# Patient Record
Sex: Female | Born: 1940 | Race: Black or African American | Hispanic: No | Smoking: Never smoker
Health system: Southern US, Community
[De-identification: ages and names within clinical notes are randomized; demographics above are authoritative.]

## PROBLEM LIST (undated history)

## (undated) DIAGNOSIS — Z9889 Other specified postprocedural states: Secondary | ICD-10-CM

## (undated) DIAGNOSIS — Z90710 Acquired absence of both cervix and uterus: Secondary | ICD-10-CM

## (undated) HISTORY — PX: COLONOSCOPY: SHX174

## (undated) HISTORY — PX: ABDOMINAL HYSTERECTOMY: SHX81

## (undated) HISTORY — PX: FOOT SURGERY: SHX648

---

## 2010-11-11 HISTORY — PX: KNEE ARTHROPLASTY: SHX992

## 2011-11-12 HISTORY — PX: KNEE ARTHROPLASTY: SHX992

## 2019-05-15 ENCOUNTER — Encounter (HOSPITAL_COMMUNITY): Payer: Self-pay | Admitting: Emergency Medicine

## 2019-05-15 ENCOUNTER — Emergency Department (HOSPITAL_COMMUNITY): Payer: Medicare Other

## 2019-05-15 ENCOUNTER — Emergency Department (HOSPITAL_BASED_OUTPATIENT_CLINIC_OR_DEPARTMENT_OTHER): Payer: Medicare Other

## 2019-05-15 ENCOUNTER — Other Ambulatory Visit: Payer: Self-pay

## 2019-05-15 ENCOUNTER — Observation Stay (HOSPITAL_COMMUNITY)
Admission: EM | Admit: 2019-05-15 | Discharge: 2019-05-16 | Disposition: A | Discharge status: Home / Self Care | Payer: Medicare Other | Attending: Internal Medicine | Admitting: Internal Medicine

## 2019-05-15 DIAGNOSIS — Z1159 Encounter for screening for other viral diseases: Secondary | ICD-10-CM | POA: Diagnosis not present

## 2019-05-15 DIAGNOSIS — I2699 Other pulmonary embolism without acute cor pulmonale: Secondary | ICD-10-CM

## 2019-05-15 DIAGNOSIS — Z86718 Personal history of other venous thrombosis and embolism: Secondary | ICD-10-CM | POA: Diagnosis not present

## 2019-05-15 DIAGNOSIS — Z7901 Long term (current) use of anticoagulants: Secondary | ICD-10-CM | POA: Diagnosis not present

## 2019-05-15 DIAGNOSIS — I361 Nonrheumatic tricuspid (valve) insufficiency: Secondary | ICD-10-CM | POA: Diagnosis not present

## 2019-05-15 DIAGNOSIS — I82431 Acute embolism and thrombosis of right popliteal vein: Secondary | ICD-10-CM | POA: Diagnosis present

## 2019-05-15 DIAGNOSIS — Z79899 Other long term (current) drug therapy: Secondary | ICD-10-CM | POA: Insufficient documentation

## 2019-05-15 HISTORY — DX: Other specified postprocedural states: Z98.890

## 2019-05-15 HISTORY — DX: Acquired absence of both cervix and uterus: Z90.710

## 2019-05-15 LAB — SARS CORONAVIRUS 2 BY RT PCR (HOSPITAL ORDER, PERFORMED IN ~~LOC~~ HOSPITAL LAB): SARS Coronavirus 2: NEGATIVE

## 2019-05-15 LAB — COMPREHENSIVE METABOLIC PANEL
ALT: 15 U/L (ref 0–44)
AST: 22 U/L (ref 15–41)
Albumin: 3.8 g/dL (ref 3.5–5.0)
Alkaline Phosphatase: 76 U/L (ref 38–126)
Anion gap: 11 (ref 5–15)
BUN: 13 mg/dL (ref 8–23)
CO2: 21 mmol/L — ABNORMAL LOW (ref 22–32)
Calcium: 9.7 mg/dL (ref 8.9–10.3)
Chloride: 105 mmol/L (ref 98–111)
Creatinine, Ser: 1.17 mg/dL — ABNORMAL HIGH (ref 0.44–1.00)
GFR calc Af Amer: 52 mL/min — ABNORMAL LOW (ref >60)
GFR calc non Af Amer: 45 mL/min — ABNORMAL LOW (ref >60)
Glucose, Bld: 107 mg/dL — ABNORMAL HIGH (ref 70–99)
Potassium: 3.9 mmol/L (ref 3.5–5.1)
Sodium: 137 mmol/L (ref 135–145)
Total Bilirubin: 1.5 mg/dL — ABNORMAL HIGH (ref 0.3–1.2)
Total Protein: 7.6 g/dL (ref 6.5–8.1)

## 2019-05-15 LAB — CBC
HCT: 41.4 % (ref 36.0–46.0)
Hemoglobin: 14.1 g/dL (ref 12.0–15.0)
MCH: 29.6 pg (ref 26.0–34.0)
MCHC: 34.1 g/dL (ref 30.0–36.0)
MCV: 86.8 fL (ref 80.0–100.0)
Platelets: 142 10*3/uL — ABNORMAL LOW (ref 150–400)
RBC: 4.77 MIL/uL (ref 3.87–5.11)
RDW: 13.6 % (ref 11.5–15.5)
WBC: 9.1 10*3/uL (ref 4.0–10.5)
nRBC: 0 % (ref 0.0–0.2)

## 2019-05-15 LAB — ECHOCARDIOGRAM COMPLETE
Height: 63 in
Weight: 2368 oz

## 2019-05-15 LAB — TROPONIN I (HIGH SENSITIVITY)
Troponin I (High Sensitivity): 4 ng/L (ref <18)
Troponin I (High Sensitivity): 4 ng/L (ref <18)

## 2019-05-15 LAB — D-DIMER, QUANTITATIVE: D-Dimer, Quant: >20 ug/mL-FEU — ABNORMAL HIGH (ref 0.00–0.50)

## 2019-05-15 MED ORDER — HEPARIN BOLUS VIA INFUSION
4000.0000 [IU] | Freq: Once | INTRAVENOUS | Status: AC
  Administered 2019-05-15: 13:00:00 4000 [IU] via INTRAVENOUS
  Filled 2019-05-15: qty 4000

## 2019-05-15 MED ORDER — SODIUM CHLORIDE 0.9% FLUSH: 3.0000 mL | Freq: Once | INTRAVENOUS | Status: DC

## 2019-05-15 MED ORDER — MORPHINE SULFATE (PF) 4 MG/ML IV SOLN
4.0000 mg | Freq: Once | INTRAVENOUS | Status: AC
  Administered 2019-05-15: 4 mg via INTRAVENOUS
  Filled 2019-05-15 (×2): qty 1

## 2019-05-15 MED ORDER — RIVAROXABAN 15 MG PO TABS
15.0000 mg | ORAL_TABLET | Freq: Two times a day (BID) | ORAL | Status: DC
  Administered 2019-05-15 – 2019-05-16 (×2): 15 mg via ORAL
  Filled 2019-05-15 (×3): qty 1

## 2019-05-15 MED ORDER — VITAMIN D 25 MCG (1000 UNIT) PO TABS
1000.0000 [IU] | ORAL_TABLET | Freq: Every day | ORAL | Status: DC
  Administered 2019-05-16: 1000 [IU] via ORAL
  Filled 2019-05-15: qty 1

## 2019-05-15 MED ORDER — IOHEXOL 350 MG/ML SOLN
100.0000 mL | Freq: Once | INTRAVENOUS | Status: AC | PRN
  Administered 2019-05-15: 12:00:00 100 mL via INTRAVENOUS

## 2019-05-15 MED ORDER — KETOROLAC TROMETHAMINE 30 MG/ML IJ SOLN
15.0000 mg | Freq: Once | INTRAMUSCULAR | Status: AC
  Administered 2019-05-15: 15 mg via INTRAVENOUS
  Filled 2019-05-15: qty 1

## 2019-05-15 MED ORDER — RIVAROXABAN 20 MG PO TABS: 20.0000 mg | ORAL_TABLET | Freq: Every day | ORAL | Status: DC

## 2019-05-15 MED ORDER — KETOROLAC TROMETHAMINE 15 MG/ML IJ SOLN
15.0000 mg | Freq: Three times a day (TID) | INTRAMUSCULAR | Status: DC | PRN
  Administered 2019-05-15: 15 mg via INTRAVENOUS
  Filled 2019-05-15: qty 1

## 2019-05-15 MED ORDER — ACETAMINOPHEN 650 MG RE SUPP: 650.0000 mg | Freq: Four times a day (QID) | RECTAL | Status: DC | PRN

## 2019-05-15 MED ORDER — ACETAMINOPHEN 325 MG PO TABS: 650.0000 mg | ORAL_TABLET | Freq: Four times a day (QID) | ORAL | Status: DC | PRN

## 2019-05-15 MED ORDER — HEPARIN (PORCINE) 25000 UT/250ML-% IV SOLN
1100.0000 [IU]/h | INTRAVENOUS | Status: DC
  Administered 2019-05-15: 1100 [IU]/h via INTRAVENOUS
  Filled 2019-05-15: qty 250

## 2019-05-15 NOTE — ED Triage Notes (Addendum)
Pt reports left upper abdominal pain under the left breast, with pain with inspiration since last night. Denies N/V/D/fevers. Pt moved from [place] in April. Pt endorses slight cough but states it is chronic.

## 2019-05-15 NOTE — Progress Notes (Signed)
  Echocardiogram 2D Echocardiogram has been performed.  Tara Long 05/15/2019, 2:25 PM

## 2019-05-15 NOTE — Progress Notes (Addendum)
ANTICOAGULATION CONSULT NOTE - Initial Consult  Pharmacy Consult for heparin Indication: pulmonary embolus  No Known Allergies  Patient Measurements: Height: 5\' 3"  (160 cm)(patient reported) Weight: 148 lb (67.1 kg)(patient reported) IBW/kg (Calculated) : 52.4 Heparin Dosing Weight: 66kg  Vital Signs: Temp: 99.4 F (37.4 C) (07/04 0835) Temp Source: Oral (07/04 0835) BP: 110/71 (07/04 1100) Pulse Rate: 76 (07/04 1139)  Labs: Recent Labs    05/15/19 0847 05/15/19 1036  HGB 14.1  --   HCT 41.4  --   PLT 142*  --   CREATININE 1.17*  --   TROPONINIHS 4 4    Estimated Creatinine Clearance: 37.1 mL/min (A) (by C-G formula based on SCr of 1.17 mg/dL (H)).   Medical History: Past Medical History:  Diagnosis Date  . H/O knee surgery   . H/O: hysterectomy     Medications:  Infusions:  . heparin      Assessment: 48 yof presented to the ED with abdominal pain and shortness of breath. CT scan revealed a bilateral PE without right heart strain. To start IV heparin. Baseline Hgb is WNL and platelets are slightly low. She is not on anticoagulation PTA.   Goal of Therapy:  Heparin level 0.3-0.7 units/ml Monitor platelets by anticoagulation protocol: Yes   Plan:  Heparin bolus 4000 units IV x 1 Heparin gtt 1100 units/hr Check an 8 hr heparin level Daily heparin level and CBC  Shandell Giovanni, Rande Lawman 05/15/2019,12:39 PM  Addendum: Now changing heparin to xarelto. Will give xarelto 15mg  PO BID x 21 days then 20mg  PO daily thereafter. Will need to monitor renal function and watch closely for bleeding.   Salome Arnt, PharmD, BCPS Please see AMION for all pharmacy numbers 05/15/2019 2:10 PM

## 2019-05-15 NOTE — ED Notes (Signed)
ED TO INPATIENT HANDOFF REPORT  ED Nurse Name and Phone #:  [phone]  S Name/Age/Gender Tara Long  78 y.o. female Room/Bed: 021C/021C  Code Status   Code Status: Full Code  Home/SNF/Other Home Patient oriented to: self Is this baseline? Yes   Triage Complete: Triage complete  Chief Complaint Side Pain (under breast)  Triage Note Pt reports left upper abdominal pain under the left breast, with pain with inspiration since last night. Denies N/V/D/fevers. Pt moved from [place] in April. Pt endorses slight cough but states it is chronic.    Allergies No Known Allergies  Level of Care/Admitting Diagnosis ED Disposition    ED Disposition Condition Comment   Admit  Hospital Area: [facility] [[id number]]  Level of Care: Telemetry Medical [104]  Covid Evaluation: Confirmed COVID Negative  Diagnosis: Bilateral pulmonary embolism [facility]) [[id number]]  Admitting Physician: Aline Brochure  Attending Physician: Larey Dresser A [[id number]]  PT Class (Do Not Modify): Observation [104]  PT Acc Code (Do Not Modify): Observation [10022]       B Medical/Surgery History Past Medical History:  Diagnosis Date  . H/O knee surgery   . H/O: hysterectomy    History reviewed. No pertinent surgical history.   A IV Location/Drains/Wounds Patient Lines/Drains/Airways Status   Active Line/Drains/Airways    Name:   Placement date:   Placement time:   Site:   Days:   Peripheral IV 05/15/19 Right Antecubital   05/15/19    0846    Antecubital   less than 1          Intake/Output Last 24 hours No intake or output data in the 24 hours ending 05/15/19 1433  Labs/Imaging Results for orders placed or performed during the hospital encounter of 05/15/19 (from the past 48 hour(s))  Comprehensive metabolic panel     Status: Abnormal   Collection Time: 05/15/19  8:47 AM  Result Value Ref Range   Sodium 137 135 - 145 mmol/L   Potassium 3.9 3.5 - 5.1 mmol/L   Chloride  105 98 - 111 mmol/L   CO2 21 (L) 22 - 32 mmol/L   Glucose, Bld 107 (H) 70 - 99 mg/dL   BUN 13 8 - 23 mg/dL   Creatinine, Ser 1.17 (H) 0.44 - 1.00 mg/dL   Calcium 9.7 8.9 - 10.3 mg/dL   Total Protein 7.6 6.5 - 8.1 g/dL   Albumin 3.8 3.5 - 5.0 g/dL   AST 22 15 - 41 U/L   ALT 15 0 - 44 U/L   Alkaline Phosphatase 76 38 - 126 U/L   Total Bilirubin 1.5 (H) 0.3 - 1.2 mg/dL   GFR calc non Af Amer 45 (L) >60 mL/min   GFR calc Af Amer 52 (L) >60 mL/min   Anion gap 11 5 - 15    Comment: Performed at [facility], [address], [place], [place] [address]  CBC     Status: Abnormal   Collection Time: 05/15/19  8:47 AM  Result Value Ref Range   WBC 9.1 4.0 - 10.5 K/uL   RBC 4.77 3.87 - 5.11 MIL/uL   Hemoglobin 14.1 12.0 - 15.0 g/dL   HCT 41.4 36.0 - 46.0 %   MCV 86.8 80.0 - 100.0 fL   MCH 29.6 26.0 - 34.0 pg   MCHC 34.1 30.0 - 36.0 g/dL   RDW 13.6 11.5 - 15.5 %   Platelets 142 (L) 150 - 400 K/uL   nRBC 0.0 0.0 -  0.2 %    Comment: Performed at [facility], [address] [place], [place] [address]  Troponin I (High Sensitivity)     Status: None   Collection Time: 05/15/19  8:47 AM  Result Value Ref Range   Troponin I (High Sensitivity) 4 <18 ng/L    Comment: (NOTE) Elevated high sensitivity troponin I (hsTnI) values and significant  changes across serial measurements may suggest ACS but many other  chronic and acute conditions are known to elevate hsTnI results.  Refer to the "Links" section for chest pain algorithms and additional  guidance. Performed at [facility], [address] [place], [place] [address]   D-dimer, quantitative (not at [facility])     Status: Abnormal   Collection Time: 05/15/19  8:57 AM  Result Value Ref Range   D-Dimer, Quant >20.00 (H) 0.00 - 0.50 ug/mL-FEU    Comment: REPEATED TO VERIFY CRITICAL RESULT CALLED TO, READ BACK BY AND VERIFIED WITH: N.ZOHBI,RN @ 1020 05/15/2019 WEBBERJ (NOTE) At the manufacturer cut-off of 0.50 ug/mL FEU, this  assay has been documented to exclude PE with a sensitivity and negative predictive value of 97 to 99%.  At this time, this assay has not been approved by the FDA to exclude DVT/VTE. Results should be correlated with clinical presentation. Performed at [facility], [address] [place], [place] [address]   Troponin I (High Sensitivity)     Status: None   Collection Time: 05/15/19 10:36 AM  Result Value Ref Range   Troponin I (High Sensitivity) 4 <18 ng/L    Comment: (NOTE) Elevated high sensitivity troponin I (hsTnI) values and significant  changes across serial measurements may suggest ACS but many other  chronic and acute conditions are known to elevate hsTnI results.  Refer to the "Links" section for chest pain algorithms and additional  guidance. Performed at [facility], [address] [place], [place] [address]   SARS Coronavirus 2 (CEPHEID- Performed in [facility] hospital lab), Hosp Order     Status: None   Collection Time: 05/15/19 10:36 AM   Specimen: Nasopharyngeal Swab  Result Value Ref Range   SARS Coronavirus 2 NEGATIVE NEGATIVE    Comment: (NOTE) If result is NEGATIVE SARS-CoV-2 target nucleic acids are NOT DETECTED. The SARS-CoV-2 RNA is generally detectable in upper and lower  respiratory specimens during the acute phase of infection. The lowest  concentration of SARS-CoV-2 viral copies this assay can detect is 250  copies / mL. A negative result does not preclude SARS-CoV-2 infection  and should not be used as the sole basis for treatment or other  patient management decisions.  A negative result may occur with  improper specimen collection / handling, submission of specimen other  than nasopharyngeal swab, presence of viral mutation(s) within the  areas targeted by this assay, and inadequate number of viral copies  (<250 copies / mL). A negative result must be combined with clinical  observations, patient history, and epidemiological  information. If result is POSITIVE SARS-CoV-2 target nucleic acids are DETECTED. The SARS-CoV-2 RNA is generally detectable in upper and lower  respiratory specimens dur ing the acute phase of infection.  Positive  results are indicative of active infection with SARS-CoV-2.  Clinical  correlation with patient history and other diagnostic information is  necessary to determine patient infection status.  Positive results do  not rule out bacterial infection or co-infection with other viruses. If result is PRESUMPTIVE POSTIVE SARS-CoV-2 nucleic acids  MAY BE PRESENT.   A presumptive positive result was obtained on the submitted specimen  and confirmed on repeat testing.  While 2019 novel coronavirus  (SARS-CoV-2) nucleic acids may be present in the submitted sample  additional confirmatory testing may be necessary for epidemiological  and / or clinical management purposes  to differentiate between  SARS-CoV-2 and other Sarbecovirus currently known to infect humans.  If clinically indicated additional testing with an alternate test  methodology [phone]([id number]) is advised. The SARS-CoV-2 RNA is generally  detectable in upper and lower respiratory sp ecimens during the acute  phase of infection. The expected result is Negative. Fact Sheet for Patients:  BoilerBrush.com.cyhttps://www.fda.gov/media/136312/download Fact Sheet for Healthcare Providers: https://pope.com/https://www.fda.gov/media/136313/download This test is not yet approved or cleared by the [place] FDA and has been authorized for detection and/or diagnosis of SARS-CoV-2 by FDA under an Emergency Use Authorization (EUA).  This EUA will remain in effect (meaning this test can be used) for the duration of the COVID-19 declaration under Section 564(b)(1) of the Act, 21 U.S.C. section 360bbb-3(b)(1), unless the authorization is terminated or revoked sooner. Performed at [facility], [address] [place], [place] [address]    Dg Chest 2 View  Result  Date: 05/15/2019 CLINICAL DATA:  Chest pain. EXAM: CHEST - 2 VIEW COMPARISON:  None. FINDINGS: The heart size and mediastinal contours are within normal limits. No pneumothorax is noted. Right lung is clear. Mild left basilar atelectasis is noted with minimal pleural effusion. The visualized skeletal structures are unremarkable. IMPRESSION: Mild left basilar atelectasis is noted with minimal left pleural effusion. Electronically Signed   By: Lupita RaiderJames  Green Jr M.D.   On: 05/15/2019 09:28   Ct Angio Chest Pe W And/or Wo Contrast  Result Date: 05/15/2019 CLINICAL DATA:  78 year old female with left-sided chest pain EXAM: CT ANGIOGRAPHY CHEST WITH CONTRAST TECHNIQUE: Multidetector CT imaging of the chest was performed using the standard protocol during bolus administration of intravenous contrast. Multiplanar CT image reconstructions and MIPs were obtained to evaluate the vascular anatomy. CONTRAST:  100mL OMNIPAQUE IOHEXOL 350 MG/ML SOLN COMPARISON:  Chest x-ray 05/15/2019 FINDINGS: Cardiovascular: Adequate opacification of the pulmonary arteries to the segmental level. Positive for acute pulmonary embolus. Filling defects are present within the left main pulmonary artery extending into the left upper and lower lobe pulmonary arteries as well as segmental branches. Nearly occlusive thrombus in the left lower lobe pulmonary artery. On the right, emboli are present within segmental upper and lower lobe pulmonary arteries. The main pulmonary artery is normal in caliber. No evidence of right heart strain. No pericardial effusion. Mediastinum/Nodes: Unremarkable CT appearance of the thyroid gland. No suspicious mediastinal or hilar adenopathy. No soft tissue mediastinal mass. The thoracic esophagus is unremarkable. Lungs/Pleura: Mild dependent atelectasis. Additional focal peripheral consolidation in the posterior left lower lobe may represent a region of a developing pulmonary infarct. No suspicious nodule or mass. Upper  Abdomen: No acute abnormality within the visualized upper abdomen. Musculoskeletal: No acute fracture or aggressive appearing lytic or blastic osseous lesion. Review of the MIP images confirms the above findings. IMPRESSION: 1. Acute bilateral pulmonary emboli worse on the left than the right without evidence of acute right heart strain. There is a region of focal consolidation in the posterior aspect of the left lower lobe which may represent a developing pulmonary infarct or region of alveolar hemorrhage. 2. Mild dependent lower lobe atelectasis. These results were called by telephone at the time of interpretation on 05/15/2019 at 12:10 pm  to [facility] FAWZE , who verbally acknowledged these results. Electronically Signed   By: Malachy Moan M.D.   On: 05/15/2019 12:11   Vas [place] Lower Extremity Venous (dvt) (mc And Wl 7a-7p)  Result Date: 05/15/2019  Lower Venous Study Indications: Pulmonary embolism.  Comparison Study: No prior study. Performing Technologist: Gertie Fey MHA, RDMS, RVT, RDCS  Examination Guidelines: A complete evaluation includes B-mode imaging, spectral Doppler, color Doppler, and power Doppler as needed of all accessible portions of each vessel. Bilateral testing is considered an integral part of a complete examination. Limited examinations for reoccurring indications may be performed as noted.  +---------+---------------+---------+-----------+----------+-------+ RIGHT    CompressibilityPhasicitySpontaneityPropertiesSummary +---------+---------------+---------+-----------+----------+-------+ CFV      Full           Yes      Yes                          +---------+---------------+---------+-----------+----------+-------+ SFJ      Full                                                 +---------+---------------+---------+-----------+----------+-------+ FV Prox  Full                                                  +---------+---------------+---------+-----------+----------+-------+ FV Mid   Full                                                 +---------+---------------+---------+-----------+----------+-------+ FV DistalFull                                                 +---------+---------------+---------+-----------+----------+-------+ PFV      Full                                                 +---------+---------------+---------+-----------+----------+-------+ POP      None                    No                   Acute   +---------+---------------+---------+-----------+----------+-------+ PTV      None                    No                   Acute   +---------+---------------+---------+-----------+----------+-------+ PERO     None                    No                   Acute   +---------+---------------+---------+-----------+----------+-------+   +---------+---------------+---------+-----------+----------+-------+ LEFT     CompressibilityPhasicitySpontaneityPropertiesSummary +---------+---------------+---------+-----------+----------+-------+ CFV      Full  Yes      Yes                          +---------+---------------+---------+-----------+----------+-------+ SFJ      Full                                                 +---------+---------------+---------+-----------+----------+-------+ FV Prox  Full                                                 +---------+---------------+---------+-----------+----------+-------+ FV Mid   Full                                                 +---------+---------------+---------+-----------+----------+-------+ FV DistalFull                                                 +---------+---------------+---------+-----------+----------+-------+ PFV      Full                                                 +---------+---------------+---------+-----------+----------+-------+ POP      Full            Yes      Yes                          +---------+---------------+---------+-----------+----------+-------+ PTV      Full                                                 +---------+---------------+---------+-----------+----------+-------+ PERO     Full                                                 +---------+---------------+---------+-----------+----------+-------+     Summary: Right: Findings consistent with acute deep vein thrombosis involving the right popliteal vein, right posterior tibial veins, and right peroneal veins. No cystic structure found in the popliteal fossa. Left: There is no evidence of deep vein thrombosis in the lower extremity. No cystic structure found in the popliteal fossa.  *See table(s) above for measurements and observations.    Preliminary     Pending Labs Unresulted Labs (From admission, onward)   None      Vitals/Pain Today's Vitals   05/15/19 1245 05/15/19 1300 05/15/19 1315 05/15/19 1330  BP: 119/65 128/67 126/82 (!) 107/96  Pulse: 73     Resp: 19 20 17 16   Temp:      TempSrc:      SpO2: 99%  Weight:      Height:      PainSc:        Isolation Precautions No active isolations  Medications Medications  cholecalciferol (VITAMIN D3) tablet 1,000 Units (has no administration in time range)  acetaminophen (TYLENOL) tablet 650 mg (has no administration in time range)    Or  acetaminophen (TYLENOL) suppository 650 mg (has no administration in time range)  Rivaroxaban (XARELTO) tablet 15 mg (has no administration in time range)    Followed by  rivaroxaban (XARELTO) tablet 20 mg (has no administration in time range)  morphine 4 MG/ML injection 4 mg (4 mg Intravenous Given 05/15/19 1114)  ketorolac (TORADOL) 30 MG/ML injection 15 mg (15 mg Intravenous Given 05/15/19 0956)  iohexol (OMNIPAQUE) 350 MG/ML injection 100 mL (100 mLs Intravenous Contrast Given 05/15/19 1155)  heparin bolus via infusion 4,000 Units (4,000 Units  Intravenous Bolus from Bag 05/15/19 1250)    Mobility walks Low fall risk          R Recommendations: See Admitting Provider Note  Report given to:   Additional Notes:

## 2019-05-15 NOTE — ED Provider Notes (Signed)
[facility] EMERGENCY DEPARTMENT Provider Note   CSN: [id number] Arrival date & time: 05/15/19  1324    History   Chief Complaint Chief Complaint  Patient presents with  . Abdominal Pain  . Shortness of Breath    HPI Tara Long is a 78 y.o. female presents for evaluation of acute onset, constant left sided chest pain. Symptoms began around 2am, and she states the pain woke her from her sleep.  Reports the pain is aching, worsens with deep inspiration.  Denies shortness of breath but feels as though it is difficult to take a deep breath.  Denies fever, cough, abdominal pain, nausea, vomiting.  Pain is localized to the left lower chest just under the left breast.  Denies diaphoresis, lightheadedness, syncope, diarrhea, constipation.  Recently moved to [place] from [place] in April and drove down.  Denies recent travel in the last month, no recent surgeries.  Denies hemoptysis, estrogen hormone replacement therapy, prior history of DVT or PE.  She does report that 2 weeks ago she had some leg swelling to the medial right lower leg.  She reports that it was not painful and resolved on its own.      The history is provided by the patient.    Past Medical History:  Diagnosis Date  . H/O knee surgery   . H/O: hysterectomy     There are no active problems to display for this patient.   History reviewed. No pertinent surgical history.   OB History   No obstetric history on file.      Home Medications    Prior to Admission medications   Medication Sig Start Date End Date Taking? Authorizing Provider  cholecalciferol (VITAMIN D3) 25 MCG (1000 UT) tablet Take 1,000 Units by mouth daily.   Yes [provider]  Multiple Vitamin (MULTIVITAMIN WITH MINERALS) TABS tablet Take 1 tablet by mouth daily.   Yes [provider]    Family History No family history on file.  Social History Social History   Tobacco Use  . Smoking status: Not  on file  Substance Use Topics  . Alcohol use: Not on file  . Drug use: Not on file     Allergies   Patient has no known allergies.   Review of Systems Review of Systems  Constitutional: Negative for chills and fever.  Respiratory: Positive for shortness of breath.   Cardiovascular: Positive for chest pain.  Gastrointestinal: Negative for abdominal pain, nausea and vomiting.  All other systems reviewed and are negative.    Physical Exam Updated Vital Signs BP 126/82   Pulse 73   Temp 99.4 F (37.4 C) (Oral)   Resp 17   Ht  (1.6 m) Comment: patient reported  Wt 67.1 kg Comment: patient reported  SpO2 99%   BMI 26.22 kg/m   Physical Exam Vitals signs and nursing note reviewed.  Constitutional:      General: She is not in acute distress.    Appearance: She is well-developed.  HENT:     Head: Normocephalic and atraumatic.  Eyes:     General:        Right eye: No discharge.        Left eye: No discharge.     Conjunctiva/sclera: Conjunctivae normal.  Neck:     Vascular: No JVD.     Trachea: No tracheal deviation.  Cardiovascular:     Rate and Rhythm: Regular rhythm. Tachycardia present.     Heart  sounds: Normal heart sounds.     Comments: Intermittently tachycardic up to 110 bpm.   2+ radial and DP/PT pulses bilaterally, Homans sign absent bilaterally, no lower extremity edema, no palpable cords, compartments are soft  Pulmonary:     Effort: Pulmonary effort is normal.     Comments: Breath sounds diminished as patient is hesitant to take a deep breath due to pain.  Speaking in full sentences without difficulty.  No tenderness to palpation of the chest wall with no deformity, crepitus, ecchymosis, or flail segment Abdominal:     General: Abdomen is flat. Bowel sounds are normal. There is no distension.     Palpations: Abdomen is soft.     Tenderness: There is no abdominal tenderness. There is no guarding.  Skin:    General: Skin is warm and dry.      Findings: No erythema.  Neurological:     Mental Status: She is alert.  Psychiatric:        Behavior: Behavior normal.      ED Treatments / Results  Labs (all labs ordered are listed, but only abnormal results are displayed) Labs Reviewed  COMPREHENSIVE METABOLIC PANEL - Abnormal; Notable for the following components:      Result Value   CO2 21 (*)    Glucose, Bld 107 (*)    Creatinine, Ser 1.17 (*)    Total Bilirubin 1.5 (*)    GFR calc non Af Amer 45 (*)    GFR calc Af Amer 52 (*)    All other components within normal limits  CBC - Abnormal; Notable for the following components:   Platelets 142 (*)    All other components within normal limits  D-DIMER, QUANTITATIVE (NOT AT [facility]) - Abnormal; Notable for the following components:   D-Dimer, Quant >20.00 (*)    All other components within normal limits  SARS CORONAVIRUS 2 (HOSPITAL ORDER, PERFORMED IN [facility])  TROPONIN I (HIGH SENSITIVITY)  TROPONIN I (HIGH SENSITIVITY)  HEPARIN LEVEL (UNFRACTIONATED)    EKG EKG Interpretation  Date/Time:  Saturday May 15 2019 08:33:12 EDT Ventricular Rate:  99 PR Interval:    QRS Duration: 64 QT Interval:  312 QTC Calculation: 401 R Axis:   73 Text Interpretation:  Sinus rhythm No prior ECG for comparison.  No STEMI Confirmed by Theda Belfastegeler, Chris ([id number]) on 05/15/2019 8:58:52 AM   Radiology Dg Chest 2 View  Result Date: 05/15/2019 CLINICAL DATA:  Chest pain. EXAM: CHEST - 2 VIEW COMPARISON:  None. FINDINGS: The heart size and mediastinal contours are within normal limits. No pneumothorax is noted. Right lung is clear. Mild left basilar atelectasis is noted with minimal pleural effusion. The visualized skeletal structures are unremarkable. IMPRESSION: Mild left basilar atelectasis is noted with minimal left pleural effusion. Electronically Signed   By: Lupita RaiderJames  Green Jr M.D.   On: 05/15/2019 09:28   Ct Angio Chest Pe W And/or Wo Contrast  Result Date: 05/15/2019 CLINICAL  DATA:  78 year old female with left-sided chest pain EXAM: CT ANGIOGRAPHY CHEST WITH CONTRAST TECHNIQUE: Multidetector CT imaging of the chest was performed using the standard protocol during bolus administration of intravenous contrast. Multiplanar CT image reconstructions and MIPs were obtained to evaluate the vascular anatomy. CONTRAST:  100mL OMNIPAQUE IOHEXOL 350 MG/ML SOLN COMPARISON:  Chest x-ray 05/15/2019 FINDINGS: Cardiovascular: Adequate opacification of the pulmonary arteries to the segmental level. Positive for acute pulmonary embolus. Filling defects are present within the left main pulmonary artery extending into the left upper  and lower lobe pulmonary arteries as well as segmental branches. Nearly occlusive thrombus in the left lower lobe pulmonary artery. On the right, emboli are present within segmental upper and lower lobe pulmonary arteries. The main pulmonary artery is normal in caliber. No evidence of right heart strain. No pericardial effusion. Mediastinum/Nodes: Unremarkable CT appearance of the thyroid gland. No suspicious mediastinal or hilar adenopathy. No soft tissue mediastinal mass. The thoracic esophagus is unremarkable. Lungs/Pleura: Mild dependent atelectasis. Additional focal peripheral consolidation in the posterior left lower lobe may represent a region of a developing pulmonary infarct. No suspicious nodule or mass. Upper Abdomen: No acute abnormality within the visualized upper abdomen. Musculoskeletal: No acute fracture or aggressive appearing lytic or blastic osseous lesion. Review of the MIP images confirms the above findings. IMPRESSION: 1. Acute bilateral pulmonary emboli worse on the left than the right without evidence of acute right heart strain. There is a region of focal consolidation in the posterior aspect of the left lower lobe which may represent a developing pulmonary infarct or region of alveolar hemorrhage. 2. Mild dependent lower lobe atelectasis. These results  were called by telephone at the time of interpretation on 05/15/2019 at 12:10 pm to [facility] , who verbally acknowledged these results. Electronically Signed   By: Malachy Moan M.D.   On: 05/15/2019 12:11   Vas [place] Lower Extremity Venous (dvt) (mc And Wl 7a-7p)  Result Date: 05/15/2019  Lower Venous Study Indications: Pulmonary embolism.  Comparison Study: No prior study. Performing Technologist: Gertie Fey MHA, RDMS, RVT, RDCS  Examination Guidelines: A complete evaluation includes B-mode imaging, spectral Doppler, color Doppler, and power Doppler as needed of all accessible portions of each vessel. Bilateral testing is considered an integral part of a complete examination. Limited examinations for reoccurring indications may be performed as noted.  +---------+---------------+---------+-----------+----------+-------+ RIGHT    CompressibilityPhasicitySpontaneityPropertiesSummary +---------+---------------+---------+-----------+----------+-------+ CFV      Full           Yes      Yes                          +---------+---------------+---------+-----------+----------+-------+ SFJ      Full                                                 +---------+---------------+---------+-----------+----------+-------+ FV Prox  Full                                                 +---------+---------------+---------+-----------+----------+-------+ FV Mid   Full                                                 +---------+---------------+---------+-----------+----------+-------+ FV DistalFull                                                 +---------+---------------+---------+-----------+----------+-------+ PFV      Full                                                 +---------+---------------+---------+-----------+----------+-------+  POP      None                    No                   Acute   +---------+---------------+---------+-----------+----------+-------+  PTV      None                    No                   Acute   +---------+---------------+---------+-----------+----------+-------+ PERO     None                    No                   Acute   +---------+---------------+---------+-----------+----------+-------+   +---------+---------------+---------+-----------+----------+-------+ LEFT     CompressibilityPhasicitySpontaneityPropertiesSummary +---------+---------------+---------+-----------+----------+-------+ CFV      Full           Yes      Yes                          +---------+---------------+---------+-----------+----------+-------+ SFJ      Full                                                 +---------+---------------+---------+-----------+----------+-------+ FV Prox  Full                                                 +---------+---------------+---------+-----------+----------+-------+ FV Mid   Full                                                 +---------+---------------+---------+-----------+----------+-------+ FV DistalFull                                                 +---------+---------------+---------+-----------+----------+-------+ PFV      Full                                                 +---------+---------------+---------+-----------+----------+-------+ POP      Full           Yes      Yes                          +---------+---------------+---------+-----------+----------+-------+ PTV      Full                                                 +---------+---------------+---------+-----------+----------+-------+ PERO     Full                                                 +---------+---------------+---------+-----------+----------+-------+  Summary: Right: Findings consistent with acute deep vein thrombosis involving the right popliteal vein, right posterior tibial veins, and right peroneal veins. No cystic structure found in the popliteal fossa. Left:  There is no evidence of deep vein thrombosis in the lower extremity. No cystic structure found in the popliteal fossa.  *See table(s) above for measurements and observations.    Preliminary     Procedures .Critical Care Performed by: Jeanie SewerFawze, Jaydy Fitzhenry A, PA-C Authorized by: Jeanie SewerFawze, Shanora Christensen A, PA-C   Critical care provider statement:    Critical care time (minutes):  45   Critical care was necessary to treat or prevent imminent or life-threatening deterioration of the following conditions:  Respiratory failure   Critical care was time spent personally by me on the following activities:  Discussions with consultants, evaluation of patient's response to treatment, examination of patient, ordering and performing treatments and interventions, ordering and review of laboratory studies, ordering and review of radiographic studies, pulse oximetry, re-evaluation of patient's condition, obtaining history from patient or surrogate and review of old charts   I assumed direction of critical care for this patient from another provider in my specialty: no     (including critical care time)  Medications Ordered in ED Medications  sodium chloride flush (NS) 0.9 % injection 3 mL (3 mLs Intravenous Not Given 05/15/19 0934)  heparin ADULT infusion 100 units/mL (25000 units/24250mL sodium chloride 0.45%) (1,100 Units/hr Intravenous New Bag/Given 05/15/19 1249)  morphine 4 MG/ML injection 4 mg (4 mg Intravenous Given 05/15/19 1114)  ketorolac (TORADOL) 30 MG/ML injection 15 mg (15 mg Intravenous Given 05/15/19 0956)  iohexol (OMNIPAQUE) 350 MG/ML injection 100 mL (100 mLs Intravenous Contrast Given 05/15/19 1155)  heparin bolus via infusion 4,000 Units (4,000 Units Intravenous Bolus from Bag 05/15/19 1250)     Initial Impression / Assessment and Plan / ED Course  I have reviewed the triage vital signs and the nursing notes.  Pertinent labs & imaging results that were available during my care of the patient were reviewed by me and  considered in my medical decision making (see chart for details).        Patient presents for evaluation of acute onset pleuritic chest pain on the left which awoke her from her sleep at around 2 AM.  She is afebrile, intermittently tachycardic with low-normal oxygen saturations on room air.  Pain is not reproducible on palpation.  History of right lower extremity swelling 2 weeks ago which resolved.  With her recent car drive down from [place] I am concerned she may have a PE.  Will obtain lab work and imaging and reassess.  EKG shows normal sinus rhythm, no acute ischemic abnormalities noted.  Lab work reviewed by me shows no leukocytosis, no anemia, mild renal insufficiency.  Her d-dimer is markedly elevated so we will obtain DVT study and PE study.  Patient remains in no apparent distress.  Patient with bilateral pleural effusions with possible alveolar hemorrhage versus pulmonary infarct on the left.  Also with acute right lower extremity DVT.  Doubt ACS/MI, dissection, CHF, cardiac tamponade, or pneumonia.  Will initiate heparin.  Internal medicine teaching service to admit.  Patient seen and evaluated by Dr. Rush Landmarkegeler who agrees with assessment and plan at this time.  Final Clinical Impressions(s) / ED Diagnoses   Final diagnoses:  Other acute pulmonary embolism without acute cor pulmonale (HCC)  Acute deep vein thrombosis (DVT) of popliteal vein of right lower extremity [facility](HCC)    ED Discharge Orders  None       Debroah Baller 05/15/19 1332    Tegeler, Gwenyth Allegra, MD 05/16/19 [phone]

## 2019-05-15 NOTE — ED Notes (Signed)
Attempted Report 

## 2019-05-15 NOTE — H&P (Signed)
Date: 05/15/2019               Patient Name:  Tara Long MRN: [id number]  DOB: [birth year] Age / Sex: 78 y.o., female   PCP: Patient, No Pcp Per         Medical Service: Internal Medicine Teaching Service         Attending Physician: Dr. Rush Landmarkegeler, Tara Long, *    First Contact: Dr. Darl PikesLanier Pager: [phone]  Second Contact: Dr. Evelene Long  Pager: [phone]       After Hours (After 5p/  First Contact Pager: [phone]  weekends / holidays): Second Contact Pager: [phone]   Chief Complaint: pain with inspiration  History of Present Illness: Ms. Tara HiddenBean is a pleasant 78 yo F with no significant past medical history who presented to the ED with LUQ pain and pain with inspiration which started overnight. She reports waking up off and on with the pain and difficulty taking a breath and decided to come in after calling her daughter about it this morning. She states that the pain is an 7-8.5/10 in severity and does not radiate. She cannot describe it but states that it is not stabbing or burning or dull. Breathing makes it worse and not breathing and lying still makes it better. When her arms were raised above her head during her CT scan her pain increased. The only associated sx she can think of is swelling in her right medial ankle approximately 1 week ago which she treated with ice, was not painful, and lasted 2-3 days. She denies any recent road trips, plane travel or recent surgeries. When prompted she endorses some slight SOB for the last 1-2 weeks and a slight decrease in her energy the last 1-2 days. Patient denies abdominal pain, nausea, vomiting, diarrhea, fevers, chills, sweats, change in appetite, weight change, rhinorrhea, sore throat, rash and headache.   Meds: Patient reports no prescription medicine use but does take D3 daily as well as an unknown supplement that she purchased for help increase her energy. She does not believe there is anyone at her facility that can tell us the name of  this supplement. She denies any estrogen use.   Current Meds  Medication Sig  . cholecalciferol (VITAMIN D3) 25 MCG (1000 UT) tablet Take 1,000 Units by mouth daily.  . Multiple Vitamin (MULTIVITAMIN WITH MINERALS) TABS tablet Take 1 tablet by mouth daily.   Allergies: Allergies as of 05/15/2019  . (No Known Allergies)   Past Medical History:  Diagnosis Date  . H/O knee surgery   . H/O: hysterectomy    Patient denies any personal history of: COPD Heart disease Hypertension Diabetes Cancer Bleeding disorders  Patient is up to date on mammograms, colonoscopies and pap smears which she reports were clear as recent as a year ago   Family History:  No family history of: Bleeding disorders or heart disease  Mother had hypertension and breast cancer MGM had hypertension  Social History:  Patient lives in a retirement home and has her temperature taken every time she leaves. She was also COVID tested there which was negative.  She has lots of support in the area. She has three children and her daughter lives close by in [place]. She is very active and exercises 5 days a week at her facility. She enjoys yoga in particular. Her diet consists of juice and bread or cereal in the morning, a salad for lunch and chicken or fish in the evening.  Review of Systems: A complete ROS was negative except as per HPI.   Physical Exam: Blood pressure 119/65, pulse 73, temperature 99.4 F (37.4 C), temperature source Oral, resp. rate 19, height 5\' 3"  (1.6 m), weight 67.1 kg, SpO2 99 %. Physical Exam  Constitutional: She is oriented to person, place, and time and well-developed, well-nourished, and in no distress. No distress.  HENT:  Head: Normocephalic and atraumatic.  Neck: Normal range of motion.  Cardiovascular: Normal rate, regular rhythm, normal heart sounds and intact distal pulses.  No murmur heard. Trace edema in right lower extremity  Pulmonary/Chest: Effort normal and breath  sounds normal. No respiratory distress. She has no wheezes.  Abdominal: Soft. Bowel sounds are normal. She exhibits distension. There is no abdominal tenderness.  Musculoskeletal: Normal range of motion.  Neurological: She is alert and oriented to person, place, and time.  Skin: Skin is warm and dry. No rash noted. She is not diaphoretic. No erythema. No pallor.  Psychiatric: Mood, memory, affect and judgment normal.  Nursing note and vitals reviewed.  Labs:  Creatinine 1.17 Total Bili 1.5 WBC 9.1 Hgb 14.1 Platelets 142 High Sensitivity Troponin I: 4 --> 4 D-Dimer >20000 SARS COVID 2: negative  CXR: personally reviewed, my interpretation is slight left atelectasis and left pleural effusion   CT Angio Chest PE W/WO: acute bilateral emboli worse on left than right without evidence of acute right heart strain. There is a region of focal consolidation in the posterior aspect of the left lower lobe which may represent a developing pulmonary infarct or region of alveolar hemorrhage and mild dependent lower lobe atelectasis.   [place] Lower Extremity: Findings consistent with acute deep vein thrombosis involving the right popliteal vein, right posterior tibial veins and right peroneal veins. No cystic structure found in the popliteal fossa. No evidence of DVT in the left lower extremity or cystic structures found in the popliteal fossa.  Echo: 1. Left ventricle has normal systolic function, EF of 96-75% 2. Normal LV function, mild diastolic dysfunction, mild TR with mild pulmonary hypertension   Assessment & Plan by Problem: Tara Long is a 78 yo F with no significant past medical history who presented with acute onset pleuritic chest pain in setting of recent swelling of right lower extremity who was found on imaging to have a bilateral pulmonary embolism.  Active Problems:   Bilateral pulmonary embolism/unprovoked DVT ([facility]) -pt reports severe left sided pleuritic pain with recent right lower  extremity swelling found to have elevated D-Dimer and bilateral PE on CT angio; DVT found on lower extremity ultrasound in right popliteal vein, tibial veins and peroneal veins -no clotting disorders, recent surgeries or travel; patient reports starting an known supplement to help with energy approximately 1 month ago  -received IV heparin in the ED which has been dc'ed and pt started on Xarelto -Echo unremarkable -will be admitted overnight for observation   Dispo: Admit patient to Observation with expected length of stay less than 2 midnights.  Signed: Al Decant, MD 05/15/2019, 1:03 PM  Pager: [phone]

## 2019-05-15 NOTE — Progress Notes (Signed)
Bilateral lower extremity venous duplex completed. Refer to "CV Proc" under chart review to view preliminary results.  Critical results discussed with Dr. Sherry Ruffing.  05/15/2019 1:27 PM Maudry Mayhew, MHA, RVT, RDCS, RDMS

## 2019-05-16 DIAGNOSIS — I82431 Acute embolism and thrombosis of right popliteal vein: Secondary | ICD-10-CM

## 2019-05-16 DIAGNOSIS — I82441 Acute embolism and thrombosis of right tibial vein: Secondary | ICD-10-CM

## 2019-05-16 DIAGNOSIS — I2699 Other pulmonary embolism without acute cor pulmonale: Secondary | ICD-10-CM | POA: Diagnosis not present

## 2019-05-16 DIAGNOSIS — I82451 Acute embolism and thrombosis of right peroneal vein: Secondary | ICD-10-CM

## 2019-05-16 DIAGNOSIS — Z7901 Long term (current) use of anticoagulants: Secondary | ICD-10-CM

## 2019-05-16 MED ORDER — ACETAMINOPHEN 325 MG PO TABS: 650.0000 mg | ORAL_TABLET | Freq: Four times a day (QID) | ORAL | 0 refills | Status: AC | PRN

## 2019-05-16 MED ORDER — RIVAROXABAN (XARELTO) VTE STARTER PACK (15 & 20 MG): ORAL_TABLET | ORAL | 0 refills | Status: DC

## 2019-05-16 NOTE — Discharge Instructions (Signed)
You were admitted for a pulmonary embolism which came from the blood clot in your leg. You were prescribed a blood thinner, Xarelto, to help prevent this from happening again. Please continue to take Xarelto every day and we'll discuss with you at your follow up appointment about how long you should take it. Please also discuss with [place] the supplement you were taking and whether or not you should continue use.

## 2019-05-16 NOTE — TOC Initial Note (Signed)
Transition of Care [facility]) - Initial/Assessment Note    Patient Details  Name: Tara Long MRN: [id number] Date of Birth: 07/17/1941  Transition of Care [facility]) CM/SW Contact:    Bartholomew Crews, RN Phone Number: [phone] 05/16/2019, 2:28 PM  Clinical Narrative:                 Spoke with patient at bedside. Provided Xarelto 30 day card. Advised that pharmacy should be able to look up copay cost. Verified [facility] [place] for [facility]. PCP - Dr. Jonathon Jordan. Encouraged to follow up asap.  No other transition of care needs identified at this time.   Expected Discharge Plan: Home/Self Care Barriers to Discharge: No Barriers Identified   Patient Goals and CMS Choice   CMS Medicare.gov Compare Post Acute Care list provided to:: Patient Choice offered to / list presented to : Patient  Expected Discharge Plan and Services Expected Discharge Plan: Home/Self Care In-house Referral: NA Discharge Planning Services: CM Consult Post Acute Care Choice: NA   Expected Discharge Date: 05/16/19               DME Arranged: N/A DME Agency: NA       HH Arranged: NA          Prior Living Arrangements/Services   Lives with:: Self Patient language and need for interpreter reviewed:: Yes              Criminal Activity/Legal Involvement Pertinent to Current Situation/Hospitalization: No - Comment as needed  Activities of Daily Living Home Assistive Devices/Equipment: None ADL Screening (condition at time of admission) Patient's cognitive ability adequate to safely complete daily activities?: Yes Is the patient deaf or have difficulty hearing?: No Does the patient have difficulty seeing, even when wearing glasses/contacts?: No Does the patient have difficulty concentrating, remembering, or making decisions?: No Patient able to express need for assistance with ADLs?: Yes Does the patient have difficulty dressing or bathing?: No Independently performs ADLs?: Yes (appropriate for  developmental age) Communication: Independent Dressing (OT): Independent Grooming: Independent Feeding: Independent Bathing: Independent Toileting: Independent In/Out Bed: Independent Walks in Home: Independent Does the patient have difficulty walking or climbing stairs?: No Weakness of Legs: None Weakness of Arms/Hands: None  Permission Sought/Granted                  Emotional Assessment Appearance:: Appears stated age Attitude/Demeanor/Rapport: Engaged Affect (typically observed): Accepting Orientation: : Oriented to Self, Oriented to Place, Oriented to  Time, Oriented to Situation Alcohol / Substance Use: Not Applicable Psych Involvement: No (comment)  Admission diagnosis:  Acute deep vein thrombosis (DVT) of popliteal vein of right lower extremity (HCC) [I82.431] Other acute pulmonary embolism without acute cor pulmonale (HCC) [I26.99] Patient Active Problem List   Diagnosis Date Noted  . Bilateral pulmonary embolism ([place]) 05/15/2019   PCP:  Patient, No Pcp Per Pharmacy:   [id number] [place] [place] [address] [place] [address] Phone: [phone] Fax: [phone]     Social Determinants of Health (SDOH) Interventions    Readmission Risk Interventions No flowsheet data found.

## 2019-05-16 NOTE — Plan of Care (Signed)
  Problem: Education: Goal: Knowledge of General Education information will improve Description: Including pain rating scale, medication(s)/side effects and non-pharmacologic comfort measures Outcome: Progressing   Problem: Health Behavior/Discharge Planning: Goal: Ability to manage health-related needs will improve Outcome: Progressing   Problem: Clinical Measurements: Goal: Respiratory complications will improve Outcome: Progressing   Problem: Coping: Goal: Level of anxiety will decrease Outcome: Progressing   Problem: Pain Managment: Goal: General experience of comfort will improve Outcome: Progressing   

## 2019-05-16 NOTE — Progress Notes (Signed)
   Subjective: Tara Long is doing well this morning and reports decreased pain and SOB. We discussed her new medication and the need to stop the unknown supplement given she was so healthy prior to this episode. She has no concerns or complaints at this time.   Objective:  Vital signs in last 24 hours: Vitals:   05/15/19 1330 05/15/19 1537 05/15/19 2012 05/15/19 2309  BP: (!) 107/96 127/73 (!) 129/59 125/67  Pulse:  79 80 (!) 105  Resp: 16 20    Temp:  98.8 F (37.1 C) 99.6 F (37.6 C) 99.5 F (37.5 C)  TempSrc:  Oral Oral Oral  SpO2:  96% 99% 91%  Weight:      Height:       Physical Exam  Constitutional: She is oriented to person, place, and time and well-developed, well-nourished, and in no distress. No distress.  Cardiovascular: Normal rate, regular rhythm and normal heart sounds. Exam reveals no friction rub.  No murmur heard. Trace edema in bilateral lower extremities  Pulmonary/Chest: Breath sounds normal. No respiratory distress. She has no wheezes.  Pt taking somewhat shallow breaths secondary to moderate pain with inspiration  Musculoskeletal: Normal range of motion.  Neurological: She is alert and oriented to person, place, and time.  Skin: Skin is warm and dry. No rash noted. She is not diaphoretic. No erythema. No pallor.  Psychiatric: Mood, affect and judgment normal.  Nursing note and vitals reviewed.  No new labs or imaging.  Assessment/Plan: Ms. Mcdonald is a 78 yo F with no significant past medical history who presented with acute onset pleuritic chest pain in the setting of recent swelling of right lower extremity who was found on imaging to have a bilateral pulmonary embolism.  Active Problems:   Bilateral pulmonary embolism (HCC) -VSS overnight -pt pleuritic pain and SOB decreased -tolerating Xarelto well and counseled on bleeding side effects -also discussed necessity to stop supplement and discuss with PCP at hospital follow up -have consulted case  management to ensure price affordable for pt -plan for dc today after case management consult   Dispo: Anticipated discharge today.  Al Decant, MD 05/16/2019, 5:47 AM Pager: [phone]

## 2019-05-16 NOTE — Care Management Obs Status (Signed)
[place] NOTIFICATION   Patient Details  Name: Tara Long MRN: [id number] Date of Birth: 01/14/1941   Medicare Observation Status Notification Given:  Yes    Bartholomew Crews, RN 05/16/2019, 2:46 PM

## 2019-05-16 NOTE — Discharge Summary (Signed)
Name: Tara Long MRN: [id number] DOB: 31-Dec-1940 78 y.o. PCP: Patient, No Pcp Per  Date of Admission: 05/15/2019  8:21 AM Date of Discharge: 05/16/2019 Attending Physician: No att. providers found  Discharge Diagnosis: 1. Bilateral pulmonary embolism  Discharge Medications: Allergies as of 05/16/2019   No Known Allergies     Medication List    TAKE these medications   acetaminophen 325 MG tablet Commonly known as: TYLENOL Take 2 tablets (650 mg total) by mouth every 6 (six) hours as needed for up to 7 days for mild pain (or Fever >/= 101).   cholecalciferol 25 MCG (1000 UT) tablet Commonly known as: VITAMIN D3 Take 1,000 Units by mouth daily.   multivitamin with minerals Tabs tablet Take 1 tablet by mouth daily.   Rivaroxaban 15 & 20 MG Tbpk Take as directed on package: Start with one 15mg  tablet by mouth twice a day with food. On Day 22, switch to one 20mg  tablet once a day with food.       Disposition and follow-up:   TaraAlizee Karianna Long was discharged from [facility] in Good condition.  At the hospital follow up visit please address:  1.  Please confirm that Tara Long is able to afford her Xarelto and is continuing to take it. Please also address the length of time that she should take it given this may possibly be an unprovoked clot.   2.  Please discuss discontinuation of the unknown supplement she began 1 mo prior to her PE  Follow-up Appointments: [facility] Follow up.   Specialty: Internal Medicine Why: Our office will call you tomorrow about scheduling an appointment. Contact information: [address] [id number] [place] [facility] Course by problem list: 1. Bilateral PE -Pt presented with acute onset pleuritic chest pain in the setting of recent swelling of the right lower extremity who was found on imaging to have a  bilateral pulmonary imaging -LE dopplers demonstrated DVT in the right leg -Echo showed good LVEF and was otherwise unremarkable -pt VSS throughout admission -started on heparin initially and switched to Xarelto  -tolerated Xarelto well, discussed need to stop unknown supplement she began taking prior to this clot -discharged the following day   Discharge Vitals:   BP 117/66 (BP Location: Right Arm)   Pulse (!) 109   Temp 99.4 F (37.4 C) (Oral)   Resp 16   Ht 5\' 3"  (1.6 m) Comment: patient reported  Wt 67.1 kg Comment: patient reported  SpO2 94%   BMI 26.22 kg/m   Pertinent Labs, Studies, and Procedures:  High-sensitivity troponin 4 D-dimer >20,000 Creatinine 1.17 Hemoglobin 14.1 Platelets 142  CT Chest showed bilateral pulmonary emboli, left greater than right without evidence of right heart strain. Consolidation in the left lower lobe posterior aspect which may represent a pulmonary infarct or alveolar hemorrhage. Lower lobe atelectasis.   Echo showed no significant abnormalities.  Dopplers showed acute right DVT in the right popliteal vein, right posterior tibial vein, and right peroneal vein.   Discharge Instructions: Discharge Instructions    Activity as tolerated - No restrictions   Complete by: As directed    Call MD for:   Complete by: As directed    Swelling in your ankles and calves or shortness of breath   Diet general   Complete by: As directed  Signed: Jenell MillinerLanier, Cale Bethard, MD 05/17/2019, 12:05 PM   Pager: [phone]

## 2019-05-25 ENCOUNTER — Encounter: Payer: Self-pay | Admitting: Internal Medicine

## 2019-05-25 ENCOUNTER — Ambulatory Visit (INDEPENDENT_AMBULATORY_CARE_PROVIDER_SITE_OTHER): Payer: Medicare Other | Admitting: Internal Medicine

## 2019-05-25 ENCOUNTER — Other Ambulatory Visit: Payer: Self-pay

## 2019-05-25 DIAGNOSIS — Z86718 Personal history of other venous thrombosis and embolism: Secondary | ICD-10-CM

## 2019-05-25 DIAGNOSIS — Z7901 Long term (current) use of anticoagulants: Secondary | ICD-10-CM | POA: Diagnosis not present

## 2019-05-25 DIAGNOSIS — Z86711 Personal history of pulmonary embolism: Secondary | ICD-10-CM | POA: Diagnosis not present

## 2019-05-25 DIAGNOSIS — Z09 Encounter for follow-up examination after completed treatment for conditions other than malignant neoplasm: Secondary | ICD-10-CM | POA: Diagnosis not present

## 2019-05-25 DIAGNOSIS — I2699 Other pulmonary embolism without acute cor pulmonale: Secondary | ICD-10-CM

## 2019-05-25 NOTE — Assessment & Plan Note (Addendum)
Elise is seen in the internal medicine clinic today for a hospital follow-up following hospitalization from July 4 through July 5 for bilateral pulmonary embolisms secondary to right lower extremity DVT.  She was initially placed on heparin and then switched to Xarelto prior to discharge. Following personal review of medical history with the patient, it does appear that this was unprovoked. I did review the length of time that she will likely need to be on Xarelto with her and her daughter.  She also had inquired about any dietary restrictions with Xarelto which I noted there would not be but is encouraged to be taken with a large meal. Given that we have not found an etiology to the thrombotic event, this will likely need more investigation. Evee does not wish to establish care in this clinic however and has already made an appointment with the [facility] group for next week.  I encouraged her to keep this appointment and to obtain any age-appropriate screening necessary.  Encouraged her to reach out to our clinic in the meantime should she develop any questions or concerns.

## 2019-05-25 NOTE — Progress Notes (Signed)
   CC: b/l PE  HPI:  Tara Long is a 78 y.o. female who presents for hospital follow up. Hospitalized July 4 through July 5 following a pain in her chest that developed suddenly.  Work-up in the ED revealed bilateral PEs and Doppler ultrasound revealed DVT of the right leg.  Echo at that time was essentially unremarkable and patient was started on heparin and then switched to Xarelto.  Today in the clinic, she reports feeling wonderful.  Daughter is also present at this appointment.  Inquiry regarding the length of time she will be on Xarelto along with any diet limitations.  Tara Long denies any known precipitating event including Long periods of immobility, recent surgery, history of cancer, or any thrombotic blood disorder.  She denies any current health problems with the exception of the current PEs.  No history of blood clots in the past.  Patient will be establishing with a physician from the [facility] group.  Patient already has this appointment for next week.  Past Medical History:  Diagnosis Date  . H/O knee surgery   . H/O: hysterectomy    Review of Systems:  negative other than those stated in HPI  Physical Exam:  Vitals:   05/25/19 1413  BP: 133/78  Pulse: (!) 112  Temp: 98 F (36.7 C)  TempSrc: Oral  SpO2: 100%  Weight: 154 lb (69.9 kg)  Height: 5\' 3"  (1.6 m)    GENERAL: well appearing, in no apparent distress HEENT: no conjunctival injection. Nares patent.  CARDIAC: tachycardic rate and regular rhythm, no peripheral edema appreciated PULMONARY: lung sounds clear to auscultation. Acyanotic appearing ABDOMEN: bowel sounds active.  SKIN: no rash or lesion on limited exam NEURO: CN II-XII grossly intact   Assessment & Plan:   See Encounters Tab for problem based charting.  Pertinent labs & imaging results that were available during my care of the patient were reviewed by me and considered in my medical decision making  Patient is in  agreement with the plan and endorses no further questions at this time.  Patient seen with Dr. Angelia Mould  Mitzi Hansen, MD Internal Medicine Resident-PGY1 05/25/19

## 2019-05-25 NOTE — Patient Instructions (Addendum)
I am very glad that you are feeling better!  Please establish with your PCP at [facility] as discussed for further management of your blood clots and to get age appropriate screenings.

## 2019-05-27 NOTE — Progress Notes (Signed)
Internal Medicine Clinic Attending  I saw and evaluated the patient.  I personally confirmed the key portions of the history and exam documented by Dr. Christian   and I reviewed pertinent patient test results.  The assessment, diagnosis, and plan were formulated together and I agree with the documentation in the resident's note.  

## 2019-11-12 DIAGNOSIS — I2699 Other pulmonary embolism without acute cor pulmonale: Secondary | ICD-10-CM

## 2019-11-12 DIAGNOSIS — I82409 Acute embolism and thrombosis of unspecified deep veins of unspecified lower extremity: Secondary | ICD-10-CM

## 2019-11-12 HISTORY — DX: Acute embolism and thrombosis of unspecified deep veins of unspecified lower extremity: I82.409

## 2019-11-12 HISTORY — DX: Other pulmonary embolism without acute cor pulmonale: I26.99

## 2020-06-19 ENCOUNTER — Other Ambulatory Visit: Payer: Self-pay | Admitting: Family Medicine

## 2020-06-19 DIAGNOSIS — E2839 Other primary ovarian failure: Secondary | ICD-10-CM

## 2020-06-19 DIAGNOSIS — Z1231 Encounter for screening mammogram for malignant neoplasm of breast: Secondary | ICD-10-CM

## 2020-06-26 ENCOUNTER — Other Ambulatory Visit: Payer: Self-pay | Admitting: Family Medicine

## 2020-07-27 ENCOUNTER — Other Ambulatory Visit: Payer: Self-pay

## 2020-07-27 ENCOUNTER — Ambulatory Visit: Payer: Medicare Other | Admitting: Internal Medicine

## 2020-07-27 ENCOUNTER — Encounter: Payer: Self-pay | Admitting: Internal Medicine

## 2020-07-27 VITALS — BP 136/76 | HR 110 | Ht 63.0 in | Wt 161.2 lb

## 2020-07-27 DIAGNOSIS — R002 Palpitations: Secondary | ICD-10-CM | POA: Diagnosis not present

## 2020-07-27 DIAGNOSIS — Z86718 Personal history of other venous thrombosis and embolism: Secondary | ICD-10-CM

## 2020-07-27 DIAGNOSIS — Z86711 Personal history of pulmonary embolism: Secondary | ICD-10-CM

## 2020-07-27 MED ORDER — RIVAROXABAN 20 MG PO TABS: 20.0000 mg | ORAL_TABLET | Freq: Every day | ORAL | 3 refills | Status: AC

## 2020-07-27 MED ORDER — RIVAROXABAN 20 MG PO TABS: 20.0000 mg | ORAL_TABLET | Freq: Every day | ORAL | 3 refills | Status: DC

## 2020-07-27 NOTE — Progress Notes (Signed)
OFFICE CONSULT NOTE  Chief Complaint:  History of PE/palpitations  Primary Care Physician: Tara Hale, MD  HPI:  Tara Long is a 79 y.o. female who is being seen today for the evaluation of PE/palpitations at the request of Tara Long, *.  This is a pleasant 79 year old female who had an unprovoked pulmonary embolus secondary to a DVT in July 2020.  Prior to that she had no significant past medical history.  In fact there is no history of clotting in her family.  She had no surgery, stasis or other provoking episodes before this.  She denies any injuries to her legs.  She is a non-smoker.  No known cancer or malignancies.  She said she developed some notable swelling of her right lower extremity.  In the hospital the Dopplers indicated an acute deep vein thrombosis involving the right popliteal vein, right posterior tibial veins and right peroneal veins.  CT scan of the chest showed acute bilateral pulmonary emboli worse on the left than the right without evidence of right heart strain.  An echocardiogram was performed which function with EF of 60 to 65% and grade 1 diastolic dysfunction.  No evidence of right heart strain.  Subsequently after her initial treatment dose of Xarelto she is maintained on 20 mg daily for the past year.  She was I believe referred for evaluation of this plus the fact that she has been having some palpitations.  She says she notes occasional fluttering of her heart which occurs perhaps every few months and is very sharp self-limited.  No associated chest pain, no syncope, presyncope or other associated symptoms.  PMHx:  Past Medical History:  Diagnosis Date  . H/O knee surgery   . H/O: hysterectomy     No past surgical history on file.  FAMHx:  Family History  Problem Relation Age of Onset  . Breast cancer Mother   . Hypertension Mother   . Hypertension Maternal Grandmother     SOCHx:   reports that she has never smoked. She  has never used smokeless tobacco. She reports that she does not use drugs. No history on file for alcohol use.  ALLERGIES:  No Known Allergies  ROS: Pertinent items noted in HPI and remainder of comprehensive ROS otherwise negative.  HOME MEDS: Current Outpatient Medications on File Prior to Visit  Medication Sig Dispense Refill  . cholecalciferol (VITAMIN D3) 25 MCG (1000 UT) tablet Take 1,000 Units by mouth daily.    . Multiple Vitamin (MULTIVITAMIN WITH MINERALS) TABS tablet Take 1 tablet by mouth daily.     No current facility-administered medications on file prior to visit.    LABS/IMAGING: No results found for this or any previous visit (from the past 48 hour(s)). No results found.  LIPID PANEL: No results found for: CHOL, TRIG, HDL, CHOLHDL, VLDL, LDLCALC, LDLDIRECT  WEIGHTS: Wt Readings from Last 3 Encounters:  07/27/20 161 lb 3.2 oz (73.1 kg)  05/25/19 154 lb (69.9 kg)  05/15/19 148 lb (67.1 kg)    VITALS: BP 136/76   Pulse (!) 110   Ht 5\' 3"  (1.6 m)   Wt 161 lb 3.2 oz (73.1 kg)   SpO2 99%   BMI 28.56 kg/m   EXAM: General appearance: alert and no distress Neck: no carotid bruit, no JVD and thyroid not enlarged, symmetric, no tenderness/mass/nodules Lungs: clear to auscultation bilaterally Heart: regular rate and rhythm, S1, S2 normal, no murmur, click, rub or gallop Abdomen: soft, non-tender; bowel sounds  normal; no masses,  no organomegaly Extremities: extremities normal, atraumatic, no cyanosis or edema Pulses: 2+ and symmetric Skin: Skin color, texture, turgor normal. No rashes or lesions Neurologic: Grossly normal Psych: Pleasant  EKG: Sinus tachycardia at 110- personally reviewed  ASSESSMENT: 1. 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 Palpitations  PLAN: 1.   Tara Long had an unprovoked DVT/PE with no history of hypercoagulability, malignancies, tobacco abuse or other provoking factors.  No recent surgeries or injuries were noted.   Based on a current review of the literature, there is a high incidence of recurrent DVT/PE in patients with unprovoked DVT and therefore lifelong anticoagulation is recommended.  I did review the 2017 New England Journal of Medicine study which evaluated low-dose rivaroxaban 10 mg versus 20 mg versus aspirin for maintenance therapy to prevent recurrent DVT/PE.  This generally showed slightly lower bleeding risk with 10 versus 20 mg of rivaroxaban, however both doses were similarly efficacious at reducing risk of recurrence which was more significant than aspirin alone.  With this in mind that may be an option to treat her with a lower dose however I am concerned that possibly these episodes of palpitations could be A. fib.  If we did demonstrate that then she would certainly be underdosed at 10 mg and therefore I would recommend her staying on 20 mg daily for now.  Should there be issues in the future we may be able to lower her dose.  I have also advised a cardia mobile home monitor that she could purchase to evaluate her episodes of palpitations.  Follow-up with me annually or sooner if necessary.  Thanks again for the kind referral.  Tara Nose, MD, [facility]  [place]  [facility]  Medical Director of the Advanced Lipid Disorders &  Cardiovascular Risk Reduction Clinic Diplomate of the American Board of Clinical Lipidology Attending Cardiologist  Direct Dial: [phone]  Fax: [phone]  Website:  [email or web address] [name] 07/27/2020, 11:28 AM

## 2020-07-27 NOTE — Patient Instructions (Signed)
Medication Instructions:   Continue Xarleto 20 mg daily   *If you need a refill on your cardiac medications before your next appointment, please call your pharmacy*  Lab Work: NONE ordered at this time of appointment   If you have labs (blood work) drawn today and your tests are completely normal, you will receive your results only by: Marland Kitchen MyChart Message (if you have MyChart) OR . A paper copy in the mail If you have any lab test that is abnormal or we need to change your treatment, we will call you to review the results.  Testing/Procedures: NONE ordered at this time of appointment   Follow-Up: At [facility], you and your health needs are our priority.  As part of our continuing mission to provide you with exceptional heart care, we have created designated Provider Care Teams.  These Care Teams include your primary Cardiologist (physician) and Advanced Practice Providers (APPs -  Physician Assistants and Nurse Practitioners) who all work together to provide you with the care you need, when you need it.  We recommend signing up for the patient portal called "MyChart".  Sign up information is provided on this After Visit Summary.  MyChart is used to connect with patients for Virtual Visits (Telemedicine).  Patients are able to view lab/test results, encounter notes, upcoming appointments, etc.  Non-urgent messages can be sent to your provider as well.   To learn more about what you can do with MyChart, go to [email or web address].    Your next appointment:   1 year(s)  The format for your next appointment:   In Person  Provider:   K. Italy Hilty, MD  Other Instructions

## 2020-08-22 ENCOUNTER — Other Ambulatory Visit: Payer: Self-pay

## 2020-08-22 ENCOUNTER — Ambulatory Visit
Admission: RE | Admit: 2020-08-22 | Discharge: 2020-08-22 | Disposition: A | Discharge status: Home / Self Care | Payer: Medicare Other | Source: Ambulatory Visit | Attending: Family Medicine | Admitting: Family Medicine

## 2020-08-22 DIAGNOSIS — E2839 Other primary ovarian failure: Secondary | ICD-10-CM

## 2020-12-19 DIAGNOSIS — D6869 Other thrombophilia: Secondary | ICD-10-CM | POA: Diagnosis not present

## 2020-12-19 DIAGNOSIS — E7800 Pure hypercholesterolemia, unspecified: Secondary | ICD-10-CM | POA: Diagnosis not present

## 2020-12-19 DIAGNOSIS — Z86718 Personal history of other venous thrombosis and embolism: Secondary | ICD-10-CM | POA: Diagnosis not present

## 2021-01-09 DIAGNOSIS — H35342 Macular cyst, hole, or pseudohole, left eye: Secondary | ICD-10-CM | POA: Diagnosis not present

## 2021-02-20 ENCOUNTER — Encounter (INDEPENDENT_AMBULATORY_CARE_PROVIDER_SITE_OTHER): Payer: Self-pay

## 2021-02-20 DIAGNOSIS — H35342 Macular cyst, hole, or pseudohole, left eye: Secondary | ICD-10-CM | POA: Diagnosis not present

## 2021-02-20 DIAGNOSIS — H18413 Arcus senilis, bilateral: Secondary | ICD-10-CM | POA: Diagnosis not present

## 2021-02-20 DIAGNOSIS — H25013 Cortical age-related cataract, bilateral: Secondary | ICD-10-CM | POA: Diagnosis not present

## 2021-02-20 DIAGNOSIS — H2513 Age-related nuclear cataract, bilateral: Secondary | ICD-10-CM | POA: Diagnosis not present

## 2021-02-20 DIAGNOSIS — H40022 Open angle with borderline findings, high risk, left eye: Secondary | ICD-10-CM | POA: Diagnosis not present

## 2021-02-20 DIAGNOSIS — H2511 Age-related nuclear cataract, right eye: Secondary | ICD-10-CM | POA: Diagnosis not present

## 2021-03-19 DIAGNOSIS — H2511 Age-related nuclear cataract, right eye: Secondary | ICD-10-CM | POA: Diagnosis not present

## 2021-03-20 DIAGNOSIS — H2512 Age-related nuclear cataract, left eye: Secondary | ICD-10-CM | POA: Diagnosis not present

## 2021-04-16 ENCOUNTER — Ambulatory Visit (INDEPENDENT_AMBULATORY_CARE_PROVIDER_SITE_OTHER): Payer: Medicare Other | Admitting: Ophthalmology

## 2021-04-16 ENCOUNTER — Other Ambulatory Visit: Payer: Self-pay

## 2021-04-16 ENCOUNTER — Encounter (INDEPENDENT_AMBULATORY_CARE_PROVIDER_SITE_OTHER): Payer: Self-pay | Admitting: Ophthalmology

## 2021-04-16 DIAGNOSIS — H35352 Cystoid macular degeneration, left eye: Secondary | ICD-10-CM | POA: Diagnosis not present

## 2021-04-16 DIAGNOSIS — H35371 Puckering of macula, right eye: Secondary | ICD-10-CM | POA: Diagnosis not present

## 2021-04-16 DIAGNOSIS — Z961 Presence of intraocular lens: Secondary | ICD-10-CM | POA: Diagnosis not present

## 2021-04-16 DIAGNOSIS — H59022 Cataract (lens) fragments in eye following cataract surgery, left eye: Secondary | ICD-10-CM | POA: Insufficient documentation

## 2021-04-16 DIAGNOSIS — H35342 Macular cyst, hole, or pseudohole, left eye: Secondary | ICD-10-CM

## 2021-04-16 DIAGNOSIS — H2512 Age-related nuclear cataract, left eye: Secondary | ICD-10-CM | POA: Diagnosis not present

## 2021-04-16 DIAGNOSIS — H5212 Myopia, left eye: Secondary | ICD-10-CM | POA: Diagnosis not present

## 2021-04-16 NOTE — Progress Notes (Signed)
04/16/2021     CHIEF COMPLAINT Patient presents for Retina Evaluation (WIP retained lens fragment OS - Ref'd by Dr. Renato Gails c/o scratchy sensation OS "like something is sticking." Pt c/o burning sensation OS. Pt sts she just came from surgery OS.)   HISTORY OF PRESENT ILLNESS: Tara Long is a 80 y.o. female who presents to the clinic today for:   HPI    Retina Evaluation    Laterality: left eye   Onset: 1 hour ago   Duration: 1 hour   Treatments tried: eye drops   Comments: WIP retained lens fragment OS - Ref'd by Dr. Talbert Forest  Pt c/o scratchy sensation OS "like something is sticking." Pt c/o burning sensation OS. Pt sts she just came from surgery OS.       Last edited by Rockie Neighbours, [place] on 04/16/2021  2:35 PM. (History)      Referring physician: Glenis Smoker, MD [place],  [place] [address]  HISTORICAL INFORMATION:   Selected notes from the MEDICAL RECORD NUMBER       CURRENT MEDICATIONS: Current Outpatient Medications (Ophthalmic Drugs)  Medication Sig  . DUREZOL 0.05 % EMUL Place into the right eye.  Marland Kitchen gatifloxacin (ZYMAXID) 0.5 % SOLN Place 1 drop into the left eye 4 (four) times daily.  Marland Kitchen PROLENSA 0.07 % SOLN Place 1 drop into the left eye at bedtime.   No current facility-administered medications for this visit. (Ophthalmic Drugs)   Current Outpatient Medications (Other)  Medication Sig  . cholecalciferol (VITAMIN D3) 25 MCG (1000 UT) tablet Take 1,000 Units by mouth daily.  . Multiple Vitamin (MULTIVITAMIN WITH MINERALS) TABS tablet Take 1 tablet by mouth daily.  . rivaroxaban (XARELTO) 20 MG TABS tablet Take 1 tablet (20 mg total) by mouth daily with supper.   No current facility-administered medications for this visit. (Other)      REVIEW OF SYSTEMS:    ALLERGIES No Known Allergies  PAST MEDICAL HISTORY Past Medical History:  Diagnosis Date  . H/O knee surgery   . H/O: hysterectomy    History reviewed. No  pertinent surgical history.  FAMILY HISTORY Family History  Problem Relation Age of Onset  . Breast cancer Mother   . Hypertension Mother   . Hypertension Maternal Grandmother     SOCIAL HISTORY Social History   Tobacco Use  . Smoking status: Never Smoker  . Smokeless tobacco: Never Used  Substance Use Topics  . Drug use: Never         OPHTHALMIC EXAM:  Base Eye Exam    Visual Acuity (ETDRS)      Right Left   Dist [place] 20/25 +1 CF at 3'   Dist ph [place]  20/400       Tonometry (Tonopen, 2:42 PM)      Right Left   Pressure 17 36       Tonometry #2 (Tonopen, 2:42 PM)      Right Left   Pressure  42       Pupils      Dark Light Shape React APD   Right 4 3 Round Slow None   Left 6 6 Round Dilated None       Visual Fields (Counting fingers)      Left Right    Full Full       Extraocular Movement      Right Left    Full Full       Neuro/Psych  Oriented x3: Yes   Mood/Affect: Normal       Dilation    Both eyes: 1.0% Mydriacyl, 2.5% Phenylephrine @ 2:42 PM        Slit Lamp and Fundus Exam    Slit Lamp Exam      Right Left   Lens Centered posterior chamber intraocular lens Centered posterior chamber intraocular lens   Anterior Vitreous Normal        Fundus Exam      Right Left   Posterior Vitreous Normal retained lens fragments, large nuclei and perinuclear   Disc Normal Cloudy view of details   C/D Ratio 0.3 0.35   Macula Normal Cloudy view of details yet macular hole apparent   Vessels Normal Normal   Periphery Normal Normal          IMAGING AND PROCEDURES  Imaging and Procedures for 04/16/21  OCT, Retina - OU - Both Eyes       Right Eye Quality was good. Scan locations included subfoveal. Central Foveal Thickness: 246. Progression has no prior data. Findings include abnormal foveal contour, epiretinal membrane.   Left Eye Quality was borderline. Progression has no prior data.   Notes Minor epiretinal membrane with foveal  perifoveal schisis, not a full-thickness hole OD  OS with full-thickness macular hole with evidence of edge retraction as well as intraretinal fluid and rounding of them  Edges of the retina and diffuse atrophy and thus poor candidate for macular hole repair at this late stage       Color Fundus Photography Optos - OU - Both Eyes       Right Eye Progression has no prior data. Disc findings include normal observations. Macula : normal observations. Vessels : normal observations. Periphery : normal observations.   Left Eye Progression has no prior data. Macula : macular hole.   Notes OS with cloudy view but retained fragments lens inferiorly       B-Scan Ultrasound - OS - Left Eye       Quality was good.   Notes Retained lens fragments inferiorly, with highly reflective in nature, no retinal tears or detachment, minor vitreous debris                ASSESSMENT/PLAN:  Cellophane retinopathy, right eye Minor, no impact on acuity right eye will observe  Cataract (lens) fragments in eye following cataract surgery, left eye The nature of retained lens fragment in the vitreous was discussed with the patient. I discussed the structure  that normally  holds that cataract lens in place is similar to a "baggy", and its weakness or disruption in the process of cataract surgery leads to dispersal of lens fragments into the vitreous (gelatin like) material behind the planned lens implant.   The need for vitrectomy with removal of lens fragments and possible placement of a secondary intraocular lens implant discussed.  Large cataract lens fragments may trigger inflammation which can impact the internal structures of the eye and vision.  Some instances require repositioning of initial intraocular lens implant, or possibly the removal or exchange of the initial lens implant, and replacement with a different style of lens implant that is suitable for the new eye condition.  The occurrence of  lens fragments retained after cataract surgery does have attendant risk of retinal tears and retinal detachments. These issues may be addressed in the office or operating room upon discovery during the initial surgery or recovery period.  OS, successful intraocular lens placement at the time  of cataract surgery today.  Will thus need only vitrectomy removal of retained lens fragments via vitrectomy, under local MAC in the near future      ICD-10-CM   1. Cataract (lens) fragments in eye following cataract surgery, left eye  H59.022 Color Fundus Photography Optos - OU - Both Eyes    B-Scan Ultrasound - OS - Left Eye  2. 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 Macular hole of left eye  H35.342 OCT, Retina - OU - Both Eyes  4. Cystoid macular edema, left eye  H35.352 OCT, Retina - OU - Both Eyes  5. 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  Patient is instructed to commence with Durezol and gatifloxacin to the left eye today as she would normally do after routine cataract surgery.  2.  We will plan vitrectomy removal of retained lens fragments possible Fragmatome, left eye under local MAC at [place] midday tomorrow 04-17-2021  3.  Risk and benefits reviewed.  Macular hole left eye is not reparable and thus no attention will be delivered or directed in that direction today or in the surgery soon  Ophthalmic Meds Ordered this visit:  No orders of the defined types were placed in this encounter.      Return [facility], for Schedule vitrectomy, removal of lens fragments left eye.  There are no Patient Instructions on file for this visit.   Explained the diagnoses, plan, and follow up with the patient and they expressed understanding.  Patient expressed understanding of the importance of proper follow up care.   Clent Demark Hollis Oh M.D. Diseases & Surgery of the Retina and Vitreous [facility] 04/16/21     Abbreviations: M myopia (nearsighted); A astigmatism; H hyperopia (farsighted); P presbyopia; Mrx spectacle prescription;  CTL contact lenses; OD right eye; OS left eye; OU both eyes  XT exotropia; ET esotropia; PEK punctate epithelial keratitis; PEE punctate epithelial erosions; DES dry eye syndrome; MGD meibomian gland dysfunction; ATs artificial tears; PFAT's preservative free artificial tears; [place] nuclear sclerotic cataract; PSC posterior subcapsular cataract; ERM epi-retinal membrane; PVD posterior vitreous detachment; RD retinal detachment; DM diabetes mellitus; DR diabetic retinopathy; NPDR non-proliferative diabetic retinopathy; PDR proliferative diabetic retinopathy; CSME clinically significant macular edema; DME diabetic macular edema; dbh dot blot hemorrhages; CWS cotton wool spot; POAG primary open angle glaucoma; C/D cup-to-disc ratio; HVF humphrey visual field; GVF goldmann visual field; OCT optical coherence tomography; IOP intraocular pressure; BRVO Branch retinal vein occlusion; CRVO central retinal vein occlusion; CRAO central retinal artery occlusion; BRAO branch retinal artery occlusion; RT retinal tear; SB scleral buckle; PPV pars plana vitrectomy; VH Vitreous hemorrhage; PRP panretinal laser photocoagulation; IVK intravitreal kenalog; VMT vitreomacular traction; MH Macular hole;  NVD neovascularization of the disc; NVE neovascularization elsewhere; AREDS age related eye disease study; ARMD age related macular degeneration; POAG primary open angle glaucoma; EBMD epithelial/anterior basement membrane dystrophy; ACIOL anterior chamber intraocular lens; IOL intraocular lens; PCIOL posterior chamber intraocular lens; Phaco/IOL phacoemulsification with intraocular lens placement; [place] photorefractive keratectomy; LASIK laser assisted in situ keratomileusis; HTN hypertension; DM diabetes mellitus; COPD chronic obstructive pulmonary disease

## 2021-04-16 NOTE — Assessment & Plan Note (Signed)
Minor, no impact on acuity right eye will observe

## 2021-04-16 NOTE — Assessment & Plan Note (Signed)
The nature of retained lens fragment in the vitreous was discussed with the patient. I discussed the structure  that normally  holds that cataract lens in place is similar to a "baggy", and its weakness or disruption in the process of cataract surgery leads to dispersal of lens fragments into the vitreous (gelatin like) material behind the planned lens implant.   The need for vitrectomy with removal of lens fragments and possible placement of a secondary intraocular lens implant discussed.  Large cataract lens fragments may trigger inflammation which can impact the internal structures of the eye and vision.  Some instances require repositioning of initial intraocular lens implant, or possibly the removal or exchange of the initial lens implant, and replacement with a different style of lens implant that is suitable for the new eye condition.  The occurrence of lens fragments retained after cataract surgery does have attendant risk of retinal tears and retinal detachments. These issues may be addressed in the office or operating room upon discovery during the initial surgery or recovery period.  OS, successful intraocular lens placement at the time of cataract surgery today.  Will thus need only vitrectomy removal of retained lens fragments via vitrectomy, under local MAC in the near future

## 2021-04-17 ENCOUNTER — Encounter (AMBULATORY_SURGERY_CENTER): Payer: Medicare Other | Admitting: Ophthalmology

## 2021-04-17 DIAGNOSIS — H59022 Cataract (lens) fragments in eye following cataract surgery, left eye: Secondary | ICD-10-CM

## 2021-04-18 ENCOUNTER — Encounter (INDEPENDENT_AMBULATORY_CARE_PROVIDER_SITE_OTHER): Payer: Self-pay | Admitting: Ophthalmology

## 2021-04-18 ENCOUNTER — Other Ambulatory Visit: Payer: Self-pay

## 2021-04-18 ENCOUNTER — Ambulatory Visit (INDEPENDENT_AMBULATORY_CARE_PROVIDER_SITE_OTHER): Payer: Medicare Other | Admitting: Ophthalmology

## 2021-04-18 DIAGNOSIS — Z09 Encounter for follow-up examination after completed treatment for conditions other than malignant neoplasm: Secondary | ICD-10-CM

## 2021-04-18 DIAGNOSIS — H59022 Cataract (lens) fragments in eye following cataract surgery, left eye: Secondary | ICD-10-CM

## 2021-04-18 DIAGNOSIS — H35342 Macular cyst, hole, or pseudohole, left eye: Secondary | ICD-10-CM

## 2021-04-18 NOTE — Patient Instructions (Signed)
Patient instructed to not mash compress or rub the eye  Patient to resume topical medications for the left eye as prescribed prior to routine cataract surgery attempt by Dr. Vonna Kotyk.

## 2021-04-18 NOTE — Assessment & Plan Note (Signed)
Chronic macular hole, failed [place] years ago [place]

## 2021-04-18 NOTE — Progress Notes (Signed)
04/18/2021     CHIEF COMPLAINT Patient presents for Post-op Follow-up (Postop day #1 for retained lens fragments left eye, via vitrectomy and fragmentation for dense nuclear sclerotic retained lens fragment.  Underlying history of failed macular hole surgery years ago [place] state)   HISTORY OF PRESENT ILLNESS: Tara Long is a 80 y.o. female who presents to the clinic today for:   HPI    Post-op Follow-up    Laterality: left eye   Comments: Postop day #1 for retained lens fragments left eye, via vitrectomy and fragmentation for dense nuclear sclerotic retained lens fragment.  Underlying history of failed macular hole surgery years ago [place]       Last edited by Edmon Crape, MD on 04/18/2021  7:49 AM. (History)      Referring physician: Shon Hale, MD [address] [place],  [place] [address]  HISTORICAL INFORMATION:   Selected notes from the MEDICAL RECORD NUMBER       CURRENT MEDICATIONS: Current Outpatient Medications (Ophthalmic Drugs)  Medication Sig  . DUREZOL 0.05 % EMUL Place into the right eye.  Marland Kitchen gatifloxacin (ZYMAXID) 0.5 % SOLN Place 1 drop into the left eye 4 (four) times daily.  Marland Kitchen PROLENSA 0.07 % SOLN Place 1 drop into the left eye at bedtime.   No current facility-administered medications for this visit. (Ophthalmic Drugs)   Current Outpatient Medications (Other)  Medication Sig  . cholecalciferol (VITAMIN D3) 25 MCG (1000 UT) tablet Take 1,000 Units by mouth daily.  . Multiple Vitamin (MULTIVITAMIN WITH MINERALS) TABS tablet Take 1 tablet by mouth daily.  . rivaroxaban (XARELTO) 20 MG TABS tablet Take 1 tablet (20 mg total) by mouth daily with supper.   No current facility-administered medications for this visit. (Other)      REVIEW OF SYSTEMS:    ALLERGIES No Known Allergies  PAST MEDICAL HISTORY Past Medical History:  Diagnosis Date  . H/O knee surgery   . H/O: hysterectomy    No past surgical history  on file.  FAMILY HISTORY Family History  Problem Relation Age of Onset  . Breast cancer Mother   . Hypertension Mother   . Hypertension Maternal Grandmother     SOCIAL HISTORY Social History   Tobacco Use  . Smoking status: Never Smoker  . Smokeless tobacco: Never Used  Substance Use Topics  . Drug use: Never         OPHTHALMIC EXAM:  Base Eye Exam    Visual Acuity (ETDRS)      Right Left   Dist [place]  CF at 6'   Dist ph [place]  NI       Tonometry (Tonopen, 7:49 AM)      Right Left   Pressure  18       Neuro/Psych    Oriented x3: Yes   Mood/Affect: Normal        Slit Lamp and Fundus Exam    External Exam      Right Left   External Normal Normal       Slit Lamp Exam      Right Left   Lids/Lashes  Normal   Conjunctiva/Sclera  1+ Subconjunctival hemorrhage, Vicryl stitch superonasal cover site of Fragmatome entry   Cornea  clear, stitch temp   Anterior Chamber  Deep and quiet   [place]  Round and reactive   Lens  Centered posterior chamber intraocular lens   Anterior Vitreous  Normal  Fundus Exam      Right Left   Posterior Vitreous  Clear, avitric   Disc  Normal   C/D Ratio  0.35   Macula  Central macular hole, 700 m.   Vessels  Normal   Periphery  Normal, no holes or tears.          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 Macular hole of left eye  H35.342   2. Cataract (lens) fragments in eye following cataract surgery, left eye  H59.022   3. Postoperative follow-up  Z09     1.  Return visit here as needed or as per Dr. Vonna Kotyk  2.  3.  Ophthalmic Meds Ordered this visit:  No orders of the defined types were  placed in this encounter.      Return in about 1 week (around 04/25/2021) for POST OP, OS,, with Dr. Gweneth Dimitri office.  Patient Instructions  Patient instructed to not mash compress or rub the eye  Patient to resume topical medications for the left eye as prescribed prior to routine cataract surgery attempt by Dr. Vonna Kotyk.    Explained the diagnoses, plan, and follow up with the patient and they expressed understanding.  Patient expressed understanding of the importance of proper follow up care.   Alford Highland Tandy Grawe M.D. Diseases & Surgery of the Retina and Vitreous [facility] 04/18/21     Abbreviations: M myopia (nearsighted); A astigmatism; H hyperopia (farsighted); P presbyopia; Mrx spectacle prescription;  CTL contact lenses; OD right eye; OS left eye; OU both eyes  XT exotropia; ET esotropia; PEK punctate epithelial keratitis; PEE punctate epithelial erosions; DES dry eye syndrome; MGD meibomian gland dysfunction; ATs artificial tears; PFAT's preservative free artificial tears; NSC nuclear sclerotic cataract; PSC posterior subcapsular cataract; ERM epi-retinal membrane; PVD posterior vitreous detachment; RD retinal detachment; DM diabetes mellitus; DR diabetic retinopathy; NPDR non-proliferative diabetic retinopathy; PDR proliferative diabetic retinopathy; CSME clinically significant macular edema; DME diabetic macular edema; dbh dot blot hemorrhages; CWS cotton wool spot; POAG primary open angle glaucoma; C/D cup-to-disc ratio; HVF humphrey visual field; GVF goldmann visual field; OCT optical coherence tomography; IOP intraocular pressure; BRVO Branch retinal vein occlusion; CRVO central retinal vein occlusion; CRAO central retinal artery occlusion; BRAO branch retinal artery occlusion; RT retinal tear; SB scleral buckle; PPV pars plana vitrectomy; VH Vitreous hemorrhage; PRP panretinal laser photocoagulation; IVK intravitreal kenalog; VMT vitreomacular traction; MH Macular  hole;  NVD neovascularization of the disc; NVE neovascularization elsewhere; AREDS age related eye disease study; ARMD age related macular degeneration; POAG primary open angle glaucoma; EBMD epithelial/anterior basement membrane dystrophy; ACIOL anterior chamber intraocular lens; IOL intraocular lens; PCIOL posterior chamber intraocular lens; Phaco/IOL phacoemulsification with intraocular lens placement; PRK photorefractive keratectomy; LASIK laser assisted in situ keratomileusis; HTN hypertension; DM diabetes mellitus; COPD chronic obstructive pulmonary disease

## 2021-04-18 NOTE — Assessment & Plan Note (Signed)
Postop day #1, vitrectomy removal of retained lens fragments left eye eye  Patient to resume topical medication to the left eye

## 2021-05-22 ENCOUNTER — Encounter (INDEPENDENT_AMBULATORY_CARE_PROVIDER_SITE_OTHER): Payer: Self-pay

## 2021-05-29 ENCOUNTER — Encounter (INDEPENDENT_AMBULATORY_CARE_PROVIDER_SITE_OTHER): Payer: Self-pay

## 2021-06-25 DIAGNOSIS — M1990 Unspecified osteoarthritis, unspecified site: Secondary | ICD-10-CM | POA: Diagnosis not present

## 2021-06-25 DIAGNOSIS — R030 Elevated blood-pressure reading, without diagnosis of hypertension: Secondary | ICD-10-CM | POA: Diagnosis not present

## 2021-06-28 DIAGNOSIS — M79645 Pain in left finger(s): Secondary | ICD-10-CM | POA: Diagnosis not present

## 2021-07-03 DIAGNOSIS — E7800 Pure hypercholesterolemia, unspecified: Secondary | ICD-10-CM | POA: Diagnosis not present

## 2021-07-04 ENCOUNTER — Other Ambulatory Visit: Payer: Self-pay | Admitting: Sports Medicine

## 2021-07-04 ENCOUNTER — Ambulatory Visit
Admission: RE | Admit: 2021-07-04 | Discharge: 2021-07-04 | Disposition: A | Discharge status: Home / Self Care | Payer: Medicare Other | Source: Ambulatory Visit | Attending: Sports Medicine | Admitting: Sports Medicine

## 2021-07-04 DIAGNOSIS — M79645 Pain in left finger(s): Secondary | ICD-10-CM

## 2021-07-04 DIAGNOSIS — M1812 Unilateral primary osteoarthritis of first carpometacarpal joint, left hand: Secondary | ICD-10-CM | POA: Diagnosis not present

## 2021-07-05 DIAGNOSIS — I2699 Other pulmonary embolism without acute cor pulmonale: Secondary | ICD-10-CM | POA: Diagnosis not present

## 2021-07-05 DIAGNOSIS — Z0000 Encounter for general adult medical examination without abnormal findings: Secondary | ICD-10-CM | POA: Diagnosis not present

## 2021-07-05 DIAGNOSIS — E7800 Pure hypercholesterolemia, unspecified: Secondary | ICD-10-CM | POA: Diagnosis not present

## 2021-09-04 DIAGNOSIS — E7800 Pure hypercholesterolemia, unspecified: Secondary | ICD-10-CM | POA: Diagnosis not present

## 2022-01-02 DIAGNOSIS — R1909 Other intra-abdominal and pelvic swelling, mass and lump: Secondary | ICD-10-CM | POA: Diagnosis not present

## 2022-01-02 DIAGNOSIS — D6869 Other thrombophilia: Secondary | ICD-10-CM | POA: Diagnosis not present

## 2022-01-02 DIAGNOSIS — E7800 Pure hypercholesterolemia, unspecified: Secondary | ICD-10-CM | POA: Diagnosis not present

## 2022-01-08 ENCOUNTER — Other Ambulatory Visit: Payer: Self-pay | Admitting: Family Medicine

## 2022-01-08 DIAGNOSIS — R1909 Other intra-abdominal and pelvic swelling, mass and lump: Secondary | ICD-10-CM

## 2022-01-16 ENCOUNTER — Ambulatory Visit
Admission: RE | Admit: 2022-01-16 | Discharge: 2022-01-16 | Disposition: A | Discharge status: Home / Self Care | Payer: Medicare Other | Source: Ambulatory Visit | Attending: Family Medicine | Admitting: Family Medicine

## 2022-01-16 DIAGNOSIS — R1909 Other intra-abdominal and pelvic swelling, mass and lump: Secondary | ICD-10-CM | POA: Diagnosis not present

## 2022-03-06 ENCOUNTER — Ambulatory Visit: Payer: Self-pay | Admitting: Surgery

## 2022-03-06 DIAGNOSIS — K4090 Unilateral inguinal hernia, without obstruction or gangrene, not specified as recurrent: Secondary | ICD-10-CM | POA: Diagnosis not present

## 2022-03-06 NOTE — H&P (Signed)
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  She noted a suprapubic mass in the left lower quadrant which was varying in size but without pain.  She first noted this in September.  She thinks it may have gotten slightly larger.  It has always been reducible.  She is essentially asymptomatic from this.   Ultrasound done last month describes a fat-containing left inguinal hernia.   Reports abdominal hysterectomy as her only previous surgery.   She lives in a senior community at [place], she does live alone.  Her daughter lives in [place].   Review of Systems: A complete review of systems was obtained from the patient.  I have reviewed this information and discussed as appropriate with the patient.  See HPI as well for other ROS.     Medical History:     Past Medical History:  Diagnosis Date   Arthritis     DVT (deep venous thrombosis) (CMS-HCC)     GERD (gastroesophageal reflux disease)        There is no problem list on file for this patient.          Past Surgical History:  Procedure Laterality Date   CATARACT EXTRACTION       HYSTERECTOMY VAGINAL       REPLACEMENT TOTAL KNEE          No Known Allergies         Current Outpatient Medications on File Prior to Visit  Medication Sig Dispense Refill   rosuvastatin (CRESTOR) 10 MG tablet         XARELTO 20 mg tablet          No current facility-administered medications on file prior to visit.           Family History  Problem Relation Age of Onset   Breast cancer Mother     High blood pressure (Hypertension) Mother        Social History        Tobacco Use  Smoking Status Never  Smokeless Tobacco Never      Social History        Socioeconomic History   Marital status: Widowed  Tobacco Use   Smoking status: Never   Smokeless tobacco: Never  Substance and Sexual Activity   Alcohol use: Yes   Drug use: Never      Objective:         Vitals:    03/06/22 1436  BP: 120/60  Pulse: 105  Temp: 36.7 C (98 F)  SpO2: 96%  Weight: 77.1 kg (170 lb)  Height: 160 cm (5' 3")    Body mass index is 30.11 kg/m.   Alert, well-appearing Unlabored respirations Abdomen soft and nontender.  There is a reducible left inguinal hernia which is nontender without overlying skin change   Assessment and Plan:  Diagnoses and all orders for this visit:   Non-recurrent unilateral inguinal hernia without obstruction or gangrene     We discussed the options for repair eluding open and laparoscopic/robotic, as well as the option of ongoing observation.  We discussed the relative merits and risks of each of   these options.  For this nonrecurrent unilateral hernia I recommended open repair. We discussed the relevant anatomy and we discussed the technique of the procedure.  Discussed risks of bleeding, infection, pain, scarring, injury to structures in the area including nerves, blood vessels, bowel, bladder, risk of chronic pain, hernia recurrence, risk of seroma or hematoma, urinary retention, and risks of general anesthesia including cardiovascular, pulmonary, and thromboembolic complications.  Questions were answered.  Patient wishes to proceed with scheduling.     Corie Vavra AMANDA Catalia Massett, MD   

## 2022-03-18 IMAGING — CR DG FINGER THUMB 2+V*L*
4 series · 4 of 4 positions shown · non-contrast
Comparison: None.

CLINICAL DATA: Left thumb pain

EXAM:
LEFT THUMB 2+V

[x finger obl left]
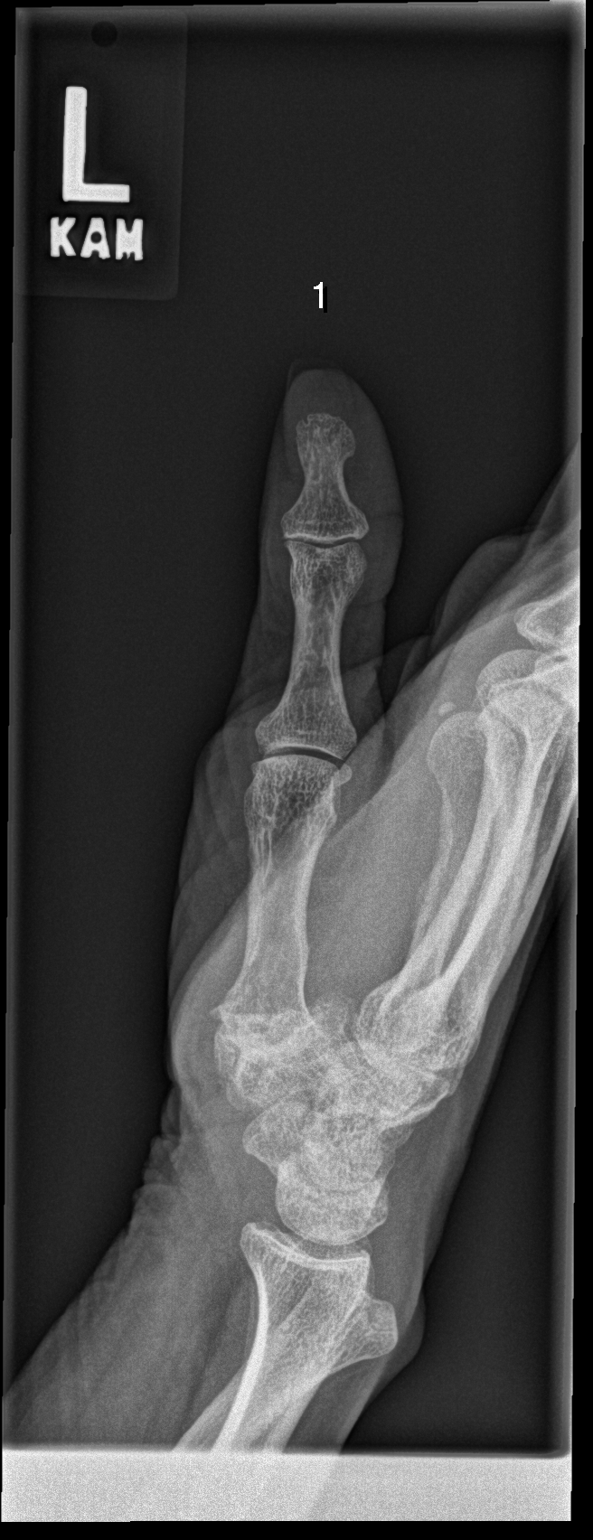

[x finger lat left (1 of 3)]
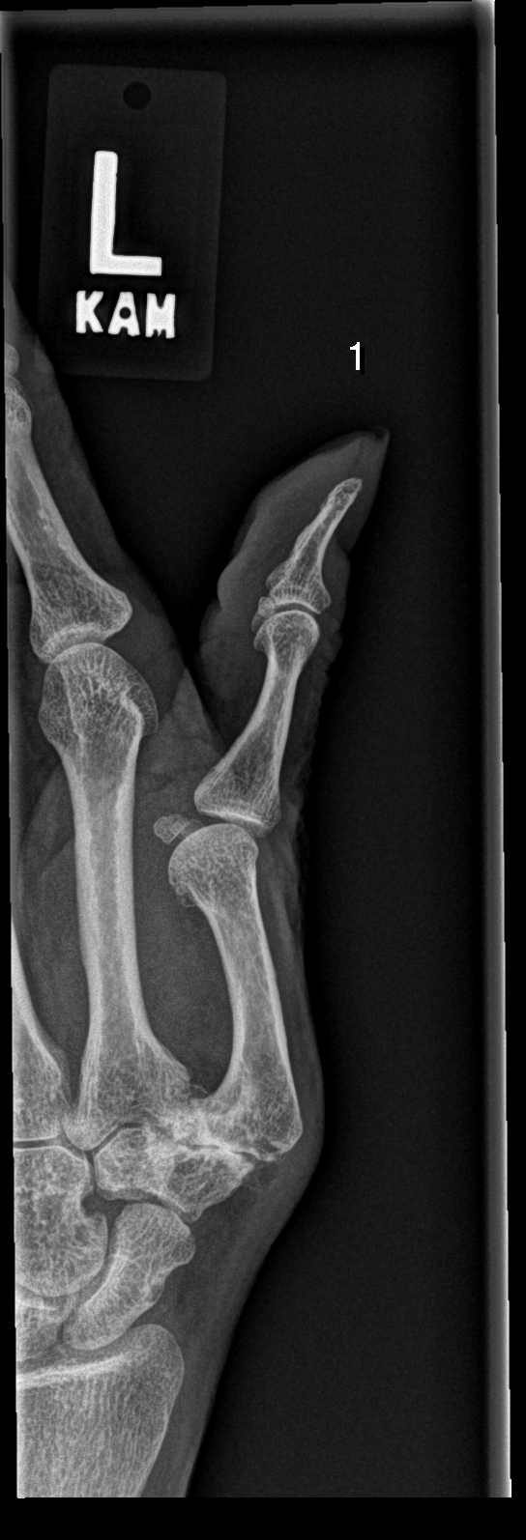

[x finger lat left (2 of 3)]
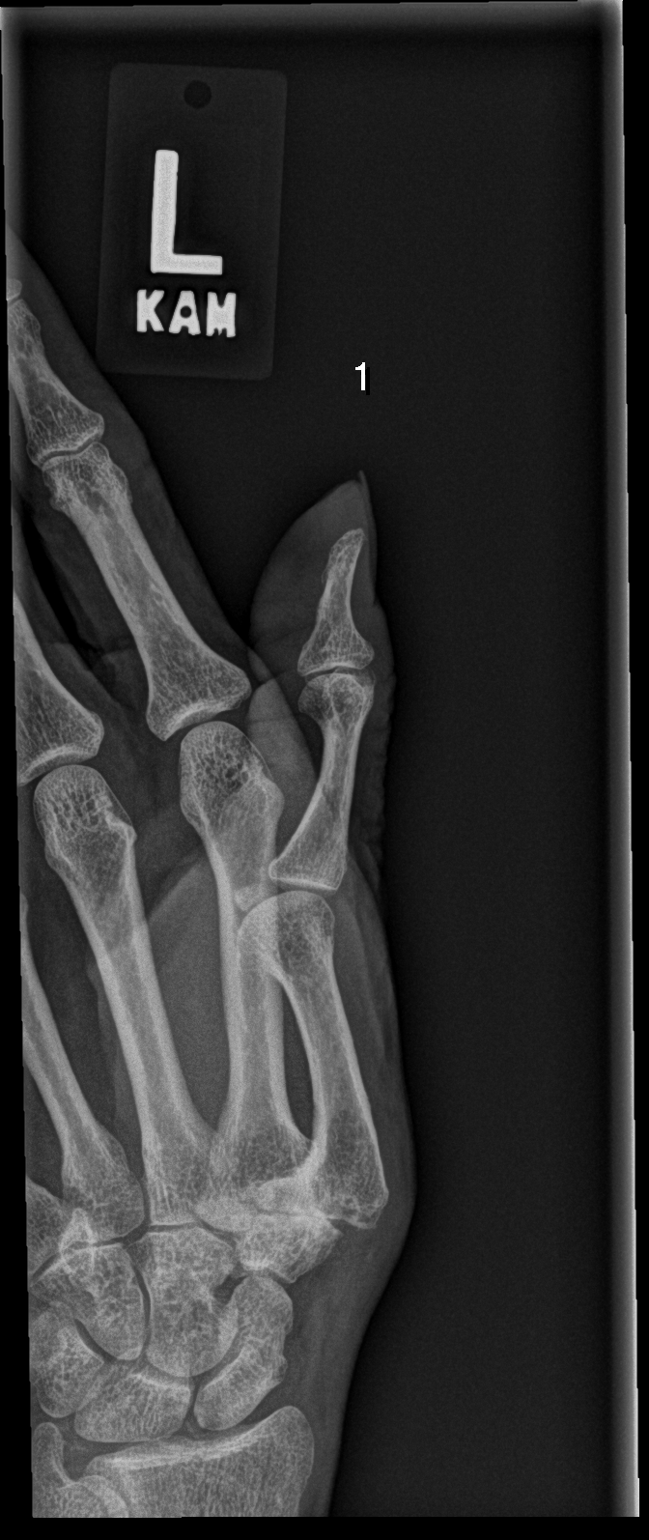

[x finger lat left (3 of 3)]
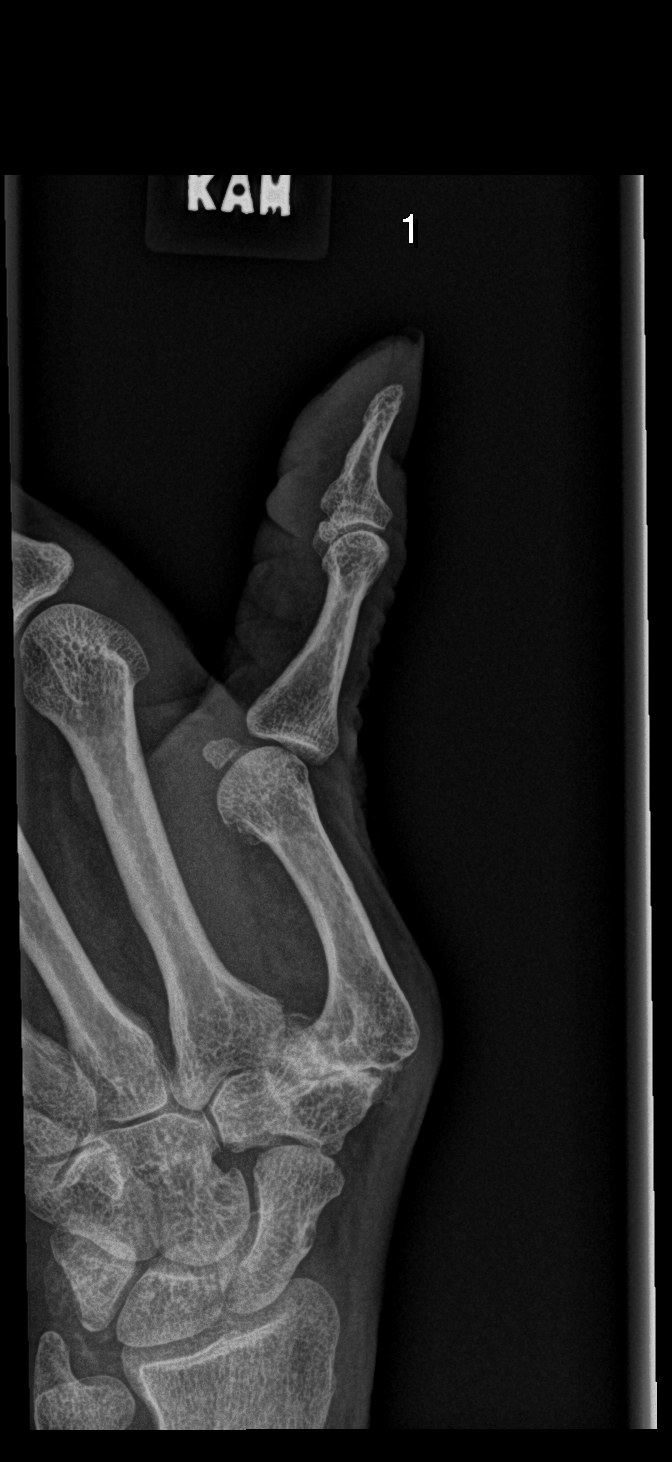

[4 of 4 positions shown; findings below may reference images not displayed]

FINDINGS: Degenerative changes of the first CMC joint are noted with mild
remodeling of the proximal aspect of the metacarpal. No acute
fracture or dislocation is seen. No soft tissue abnormality is
noted.
IMPRESSION: Degenerative changes of the first CMC joint without acute bony
abnormality.

## 2022-04-11 NOTE — Patient Instructions (Addendum)
DUE TO COVID-19 ONLY TWO VISITORS  (aged 81 and older)  ARE ALLOWED TO COME WITH YOU AND STAY IN THE WAITING ROOM ONLY DURING PRE OP AND PROCEDURE.   **NO VISITORS ARE ALLOWED IN THE SHORT STAY AREA OR RECOVERY ROOM!!**  IF YOU WILL BE ADMITTED INTO THE HOSPITAL YOU ARE ALLOWED ONLY FOUR SUPPORT PEOPLE DURING VISITATION HOURS ONLY (7 AM -8PM)   The support person(s) must pass our screening, gel in and out, and wear a mask at all times, including in the patient's room. Patients must also wear a mask when staff or their support person are in the room. Visitors GUEST BADGE MUST BE WORN VISIBLY  One adult visitor may remain with you overnight and MUST be in the room by 8 P.M.     Your procedure is scheduled on: 04/26/22   Report to [facility] Main Entrance    Report to admitting at 5:15 AM   Call this number if you have problems the morning of surgery [phone]   Do not eat food or drink:After Midnight.            If you have questions, please contact your surgeon's office.   FOLLOW BOWEL PREP AND ANY ADDITIONAL PRE OP INSTRUCTIONS YOU RECEIVED FROM YOUR SURGEON'S OFFICE!!!     Oral Hygiene is also important to reduce your risk of infection.                                    Remember - BRUSH YOUR TEETH THE MORNING OF SURGERY WITH YOUR REGULAR TOOTHPASTE   Do NOT smoke after Midnight   Take these medicines the morning of surgery with A SIP OF WATER: none   Before surgery.Stop taking Xarelto___________on __6?13________as instructed by ___Dr. Connor__________.      Bring CPAP mask and tubing day of surgery.                              You may not have any metal on your body including hair pins, jewelry, and body piercing             Do not wear make-up, lotions, powders, perfumes/cologne, or deodorant  Do not wear nail polish including gel and S&S, artificial/acrylic nails, or any other type of covering on natural nails including finger and toenails. If you have  artificial nails, gel coating, etc. that needs to be removed by a nail salon please have this removed prior to surgery or surgery may need to be canceled/ delayed if the surgeon/ anesthesia feels like they are unable to be safely monitored.   Do not shave  48 hours prior to surgery.    Do not bring valuables to the hospital. [facility] IS NOT             RESPONSIBLE   FOR VALUABLES.   Contacts, dentures or bridgework may not be worn into surgery.   Bring small overnight bag day of surgery.    Patients discharged on the day of surgery will not be allowed to drive home.  Someone NEEDS to stay with you for the first 24 hours after anesthesia.   Special Instructions: Bring a copy of your healthcare power of attorney and living will documents the day of surgery if you haven't scanned them before.  Please read over the following fact sheets you were given: IF YOU HAVE QUESTIONS ABOUT YOUR PRE-OP INSTRUCTIONS PLEASE CALL [phone]     [facility] - Preparing for Surgery Before surgery, you can play an important role.  Because skin is not sterile, your skin needs to be as free of germs as possible.  You can reduce the number of germs on your skin by washing with CHG (chlorahexidine gluconate) soap before surgery.  CHG is an antiseptic cleaner which kills germs and bonds with the skin to continue killing germs even after washing. Please DO NOT use if you have an allergy to CHG or antibacterial soaps.  If your skin becomes reddened/irritated stop using the CHG and inform your nurse when you arrive at Short Stay. Do not shave (including legs and underarms) for at least 48 hours prior to the first CHG shower.  Please follow these instructions carefully:  1.  Shower with CHG Soap the night before surgery and the  morning of Surgery.  2.  If you choose to wash your hair, wash your hair first as usual with your  normal  shampoo.  3.  After you shampoo, rinse your hair and body thoroughly to  remove the  shampoo.                            4.  Use CHG as you would any other liquid soap.  You can apply chg directly  to the skin and wash                       Gently with a scrungie or clean washcloth.  5.  Apply the CHG Soap to your body ONLY FROM THE NECK DOWN.   Do not use on face/ open                           Wound or open sores. Avoid contact with eyes, ears mouth and genitals (private parts).                       Wash face,  Genitals (private parts) with your normal soap.             6.  Wash thoroughly, paying special attention to the area where your surgery  will be performed.  7.  Thoroughly rinse your body with warm water from the neck down.  8.  DO NOT shower/wash with your normal soap after using and rinsing off  the CHG Soap.                9.  Pat yourself dry with a clean towel.            10.  Wear clean pajamas.            11.  Place clean sheets on your bed the night of your first shower and do not  sleep with pets. Day of Surgery : Do not apply any lotions/deodorants the morning of surgery.  Please wear clean clothes to the hospital/surgery center.  FAILURE TO FOLLOW THESE INSTRUCTIONS MAY RESULT IN THE CANCELLATION OF YOUR SURGERY ____________________  ________________________________________________________________________

## 2022-04-15 ENCOUNTER — Other Ambulatory Visit (HOSPITAL_COMMUNITY): Payer: Medicare Other

## 2022-04-16 ENCOUNTER — Other Ambulatory Visit: Payer: Self-pay

## 2022-04-16 ENCOUNTER — Encounter (HOSPITAL_COMMUNITY): Payer: Self-pay

## 2022-04-16 ENCOUNTER — Encounter (HOSPITAL_COMMUNITY)
Admission: RE | Admit: 2022-04-16 | Discharge: 2022-04-16 | Disposition: A | Discharge status: Home / Self Care | Payer: Medicare Other | Source: Ambulatory Visit | Attending: Surgery | Admitting: Surgery

## 2022-04-16 DIAGNOSIS — Z01818 Encounter for other preprocedural examination: Secondary | ICD-10-CM | POA: Insufficient documentation

## 2022-04-16 DIAGNOSIS — I2699 Other pulmonary embolism without acute cor pulmonale: Secondary | ICD-10-CM | POA: Diagnosis not present

## 2022-04-16 LAB — CBC
HCT: 42.2 % (ref 36.0–46.0)
Hemoglobin: 14 g/dL (ref 12.0–15.0)
MCH: 29.2 pg (ref 26.0–34.0)
MCHC: 33.2 g/dL (ref 30.0–36.0)
MCV: 88.1 fL (ref 80.0–100.0)
Platelets: 202 10*3/uL (ref 150–400)
RBC: 4.79 MIL/uL (ref 3.87–5.11)
RDW: 14.6 % (ref 11.5–15.5)
WBC: 4.5 10*3/uL (ref 4.0–10.5)
nRBC: 0 % (ref 0.0–0.2)

## 2022-04-16 NOTE — Progress Notes (Signed)
Anesthesia note:  Bowel prep reminder:NA  PCP - Dr. Quinn Axe Cardiologist -none Other-   Chest x-ray - no EKG - 04/16/22-chart Stress Test - no ECHO - no Cardiac Cath - NA  Pacemaker/ICD device last checked:NA  Sleep Study - no CPAP -   Pt is pre diabetic-NA Fasting Blood Sugar -  Checks Blood Sugar _____  Blood Thinner:Xarelto  for PE and DVT/ Dr Lindell Noe Blood Thinner Instructions:Stop 48 hours prior to DOS Aspirin Instructions: Last Dose:6/13  Anesthesia review: no  Patient denies shortness of breath, fever, cough and chest pain at PAT appointment Pt walks regularly and reports no SOB with activities.   Patient verbalized understanding of instructions that were given to them at the PAT appointment. Patient was also instructed that they will need to review over the PAT instructions again at home before surgery. yes

## 2022-04-25 NOTE — Anesthesia Preprocedure Evaluation (Addendum)
Anesthesia Evaluation  Patient identified by MRN, date of birth, ID band Patient awake    Reviewed: Allergy & Precautions, NPO status , Patient's Chart, lab work & pertinent test results  Airway Mallampati: II  TM Distance: >3 FB Neck ROM: Full    Dental no notable dental hx. (+) Missing, Partial Upper,    Pulmonary    Pulmonary exam normal breath sounds clear to auscultation       Cardiovascular + DVT  Normal cardiovascular exam+ dysrhythmias  Rhythm:Regular Rate:Normal  05/2019 echo 1. The left ventricle has normal systolic function with an ejection  fraction of 60-65%. The cavity size was normal. Left ventricular diastolic  Doppler parameters are consistent with impaired relaxation.  2. The right ventricle has normal systolic function. The cavity was  normal.  3. The tricuspid valve is grossly normal.  4. The aortic valve is tricuspid. Mild thickening of the aortic valve. No  stenosis of the aortic valve.  5. The mitral valve is grossly normal.  6. Normal LV systolic function; mild diastolic dysfunction; mild TR with  mild pulmonary hypertension.    Neuro/Psych negative neurological ROS  negative psych ROS   GI/Hepatic Neg liver ROS,   Endo/Other  negative endocrine ROS  Renal/GU      Musculoskeletal negative musculoskeletal ROS (+)   Abdominal   Peds  Hematology Lab Results      Component                Value               Date                      WBC                      4.5                 04/16/2022                HGB                      14.0                04/16/2022                HCT                      42.2                04/16/2022                MCV                      88.1                04/16/2022                PLT                      202                 04/16/2022              Anesthesia Other Findings NKA  Reproductive/Obstetrics                             Anesthesia Physical Anesthesia Plan  ASA: 3  Anesthesia Plan: General   Post-op Pain Management: Tylenol PO (pre-op)*   Induction: Intravenous  PONV Risk Score and Plan: 3 and Treatment may vary due to age or medical condition and Ondansetron  Airway Management Planned: LMA  Additional Equipment: None  Intra-op Plan:   Post-operative Plan:   Informed Consent: I have reviewed the patients History and Physical, chart, labs and discussed the procedure including the risks, benefits and alternatives for the proposed anesthesia with the patient or authorized representative who has indicated his/her understanding and acceptance.     Dental advisory given  Plan Discussed with:   Anesthesia Plan Comments:        Anesthesia Quick Evaluation

## 2022-04-26 ENCOUNTER — Other Ambulatory Visit: Payer: Self-pay

## 2022-04-26 ENCOUNTER — Ambulatory Visit (HOSPITAL_COMMUNITY)
Admission: RE | Admit: 2022-04-26 | Discharge: 2022-04-26 | Disposition: A | Discharge status: Home / Self Care | Payer: Medicare Other | Attending: Surgery | Admitting: Surgery

## 2022-04-26 ENCOUNTER — Ambulatory Visit (HOSPITAL_COMMUNITY): Payer: Medicare Other | Admitting: Emergency Medicine

## 2022-04-26 ENCOUNTER — Ambulatory Visit (HOSPITAL_BASED_OUTPATIENT_CLINIC_OR_DEPARTMENT_OTHER): Payer: Medicare Other | Admitting: Certified Registered Nurse Anesthetist

## 2022-04-26 ENCOUNTER — Encounter (HOSPITAL_COMMUNITY): Payer: Self-pay | Admitting: Surgery

## 2022-04-26 ENCOUNTER — Encounter (HOSPITAL_COMMUNITY)
Admission: RE | Disposition: A | Discharge status: Home / Self Care | Payer: Self-pay | Source: Home / Self Care | Attending: Surgery

## 2022-04-26 DIAGNOSIS — E669 Obesity, unspecified: Secondary | ICD-10-CM | POA: Diagnosis not present

## 2022-04-26 DIAGNOSIS — K4090 Unilateral inguinal hernia, without obstruction or gangrene, not specified as recurrent: Secondary | ICD-10-CM | POA: Diagnosis not present

## 2022-04-26 DIAGNOSIS — E785 Hyperlipidemia, unspecified: Secondary | ICD-10-CM | POA: Insufficient documentation

## 2022-04-26 DIAGNOSIS — Z86718 Personal history of other venous thrombosis and embolism: Secondary | ICD-10-CM | POA: Insufficient documentation

## 2022-04-26 DIAGNOSIS — Z6829 Body mass index (BMI) 29.0-29.9, adult: Secondary | ICD-10-CM | POA: Diagnosis not present

## 2022-04-26 DIAGNOSIS — I4891 Unspecified atrial fibrillation: Secondary | ICD-10-CM | POA: Diagnosis not present

## 2022-04-26 DIAGNOSIS — Z7901 Long term (current) use of anticoagulants: Secondary | ICD-10-CM | POA: Diagnosis not present

## 2022-04-26 DIAGNOSIS — I2699 Other pulmonary embolism without acute cor pulmonale: Secondary | ICD-10-CM

## 2022-04-26 DIAGNOSIS — I82409 Acute embolism and thrombosis of unspecified deep veins of unspecified lower extremity: Secondary | ICD-10-CM | POA: Diagnosis not present

## 2022-04-26 HISTORY — PX: INGUINAL HERNIA REPAIR: SHX194

## 2022-04-26 SURGERY — REPAIR, HERNIA, INGUINAL, ADULT
Anesthesia: General | Laterality: Left

## 2022-04-26 MED ORDER — GABAPENTIN 300 MG PO CAPS
300.0000 mg | ORAL_CAPSULE | ORAL | Status: AC
  Administered 2022-04-26: 300 mg via ORAL
  Filled 2022-04-26: qty 1

## 2022-04-26 MED ORDER — BUPIVACAINE LIPOSOME 1.3 % IJ SUSP: 20.0000 mL | Freq: Once | INTRAMUSCULAR | Status: DC

## 2022-04-26 MED ORDER — SODIUM CHLORIDE 0.9 % IV SOLN
250.0000 mL | INTRAVENOUS | Status: DC | PRN
Start: 2022-04-26 — End: 2022-04-26

## 2022-04-26 MED ORDER — ONDANSETRON HCL 4 MG/2ML IJ SOLN: 4.0000 mg | Freq: Once | INTRAMUSCULAR | Status: DC | PRN

## 2022-04-26 MED ORDER — 0.9 % SODIUM CHLORIDE (POUR BTL) OPTIME
TOPICAL | Status: DC | PRN
  Administered 2022-04-26: 1000 mL

## 2022-04-26 MED ORDER — DEXMEDETOMIDINE (PRECEDEX) IN NS 20 MCG/5ML (4 MCG/ML) IV SYRINGE
PREFILLED_SYRINGE | INTRAVENOUS | Status: DC | PRN
  Administered 2022-04-26: 4 ug via INTRAVENOUS

## 2022-04-26 MED ORDER — FENTANYL CITRATE (PF) 100 MCG/2ML IJ SOLN
INTRAMUSCULAR | Status: AC
  Filled 2022-04-26: qty 2

## 2022-04-26 MED ORDER — FENTANYL CITRATE PF 50 MCG/ML IJ SOSY: 25.0000 ug | PREFILLED_SYRINGE | INTRAMUSCULAR | Status: DC | PRN

## 2022-04-26 MED ORDER — DEXAMETHASONE SODIUM PHOSPHATE 10 MG/ML IJ SOLN
INTRAMUSCULAR | Status: AC
  Filled 2022-04-26: qty 1

## 2022-04-26 MED ORDER — ONDANSETRON HCL 4 MG/2ML IJ SOLN
INTRAMUSCULAR | Status: AC
  Filled 2022-04-26: qty 2

## 2022-04-26 MED ORDER — BUPIVACAINE-EPINEPHRINE (PF) 0.25% -1:200000 IJ SOLN
INTRAMUSCULAR | Status: AC
  Filled 2022-04-26: qty 30

## 2022-04-26 MED ORDER — SODIUM CHLORIDE 0.9% FLUSH: 3.0000 mL | Freq: Two times a day (BID) | INTRAVENOUS | Status: DC

## 2022-04-26 MED ORDER — PHENYLEPHRINE 80 MCG/ML (10ML) SYRINGE FOR IV PUSH (FOR BLOOD PRESSURE SUPPORT)
PREFILLED_SYRINGE | INTRAVENOUS | Status: DC | PRN
  Administered 2022-04-26 (×7): 80 ug via INTRAVENOUS

## 2022-04-26 MED ORDER — LACTATED RINGERS IV SOLN: INTRAVENOUS | Status: DC

## 2022-04-26 MED ORDER — DOCUSATE SODIUM 100 MG PO CAPS: 100.0000 mg | ORAL_CAPSULE | Freq: Two times a day (BID) | ORAL | 0 refills | Status: AC

## 2022-04-26 MED ORDER — ACETAMINOPHEN 10 MG/ML IV SOLN: 1000.0000 mg | Freq: Once | INTRAVENOUS | Status: DC | PRN

## 2022-04-26 MED ORDER — ORAL CARE MOUTH RINSE: 15.0000 mL | Freq: Once | OROMUCOSAL | Status: AC

## 2022-04-26 MED ORDER — PROPOFOL 10 MG/ML IV BOLUS
INTRAVENOUS | Status: DC | PRN
  Administered 2022-04-26: 100 mg via INTRAVENOUS

## 2022-04-26 MED ORDER — CEFAZOLIN SODIUM-DEXTROSE 2-4 GM/100ML-% IV SOLN
2.0000 g | INTRAVENOUS | Status: AC
  Administered 2022-04-26: 2 g via INTRAVENOUS
  Filled 2022-04-26: qty 100

## 2022-04-26 MED ORDER — BUPIVACAINE LIPOSOME 1.3 % IJ SUSP
INTRAMUSCULAR | Status: DC | PRN
  Administered 2022-04-26: 20 mL

## 2022-04-26 MED ORDER — LIDOCAINE 2% (20 MG/ML) 5 ML SYRINGE
INTRAMUSCULAR | Status: DC | PRN
  Administered 2022-04-26: 80 mg via INTRAVENOUS

## 2022-04-26 MED ORDER — BUPIVACAINE-EPINEPHRINE 0.25% -1:200000 IJ SOLN
INTRAMUSCULAR | Status: DC | PRN
  Administered 2022-04-26: 30 mL

## 2022-04-26 MED ORDER — SODIUM CHLORIDE 0.9% FLUSH
3.0000 mL | INTRAVENOUS | Status: DC | PRN
Start: 2022-04-26 — End: 2022-04-26

## 2022-04-26 MED ORDER — BUPIVACAINE LIPOSOME 1.3 % IJ SUSP
INTRAMUSCULAR | Status: AC
  Filled 2022-04-26: qty 20

## 2022-04-26 MED ORDER — CHLORHEXIDINE GLUCONATE 4 % EX LIQD: 60.0000 mL | Freq: Once | CUTANEOUS | Status: DC

## 2022-04-26 MED ORDER — FENTANYL CITRATE (PF) 100 MCG/2ML IJ SOLN
INTRAMUSCULAR | Status: DC | PRN
Start: 2022-04-26 — End: 2022-04-26
  Administered 2022-04-26: 25 ug via INTRAVENOUS
  Administered 2022-04-26: 50 ug via INTRAVENOUS
  Administered 2022-04-26: 25 ug via INTRAVENOUS

## 2022-04-26 MED ORDER — OXYCODONE HCL 5 MG PO TABS
ORAL_TABLET | ORAL | Status: AC
  Filled 2022-04-26: qty 2

## 2022-04-26 MED ORDER — ACETAMINOPHEN 650 MG RE SUPP: 650.0000 mg | RECTAL | Status: DC | PRN

## 2022-04-26 MED ORDER — ACETAMINOPHEN 500 MG PO TABS
1000.0000 mg | ORAL_TABLET | ORAL | Status: AC
  Administered 2022-04-26: 1000 mg via ORAL
  Filled 2022-04-26: qty 2

## 2022-04-26 MED ORDER — OXYCODONE HCL 5 MG PO TABS
5.0000 mg | ORAL_TABLET | ORAL | Status: DC | PRN
  Administered 2022-04-26: 10 mg via ORAL

## 2022-04-26 MED ORDER — ACETAMINOPHEN 325 MG PO TABS: 650.0000 mg | ORAL_TABLET | ORAL | Status: DC | PRN

## 2022-04-26 MED ORDER — ONDANSETRON HCL 4 MG/2ML IJ SOLN
INTRAMUSCULAR | Status: DC | PRN
  Administered 2022-04-26: 4 mg via INTRAVENOUS

## 2022-04-26 MED ORDER — PHENYLEPHRINE 80 MCG/ML (10ML) SYRINGE FOR IV PUSH (FOR BLOOD PRESSURE SUPPORT)
PREFILLED_SYRINGE | INTRAVENOUS | Status: AC
  Filled 2022-04-26: qty 10

## 2022-04-26 MED ORDER — CHLORHEXIDINE GLUCONATE 0.12 % MT SOLN
15.0000 mL | Freq: Once | OROMUCOSAL | Status: AC
  Administered 2022-04-26: 15 mL via OROMUCOSAL

## 2022-04-26 MED ORDER — DEXAMETHASONE SODIUM PHOSPHATE 10 MG/ML IJ SOLN
INTRAMUSCULAR | Status: DC | PRN
  Administered 2022-04-26: 5 mg via INTRAVENOUS

## 2022-04-26 MED ORDER — PHENYLEPHRINE HCL (PRESSORS) 10 MG/ML IV SOLN
INTRAVENOUS | Status: AC
  Filled 2022-04-26: qty 1

## 2022-04-26 MED ORDER — FENTANYL CITRATE PF 50 MCG/ML IJ SOSY
PREFILLED_SYRINGE | INTRAMUSCULAR | Status: AC
  Filled 2022-04-26: qty 2

## 2022-04-26 MED ORDER — PROPOFOL 500 MG/50ML IV EMUL
INTRAVENOUS | Status: AC
  Filled 2022-04-26: qty 50

## 2022-04-26 MED ORDER — OXYCODONE HCL 5 MG PO TABS: 5.0000 mg | ORAL_TABLET | Freq: Three times a day (TID) | ORAL | 0 refills | Status: DC | PRN

## 2022-04-26 SURGICAL SUPPLY — 45 items
BAG COUNTER SPONGE SURGICOUNT (BAG) IMPLANT
BENZOIN TINCTURE PRP APPL 2/3 (GAUZE/BANDAGES/DRESSINGS) ×1 IMPLANT
BLADE SURG 15 STRL LF DISP TIS (BLADE) ×1 IMPLANT
BLADE SURG 15 STRL SS (BLADE) ×1
CHLORAPREP W/TINT 26 (MISCELLANEOUS) ×2 IMPLANT
COVER SURGICAL LIGHT HANDLE (MISCELLANEOUS) ×2 IMPLANT
DERMABOND ADVANCED (GAUZE/BANDAGES/DRESSINGS) ×1
DERMABOND ADVANCED .7 DNX12 (GAUZE/BANDAGES/DRESSINGS) IMPLANT
DRAIN PENROSE 0.5X18 (DRAIN) ×2 IMPLANT
DRAPE LAPAROSCOPIC ABDOMINAL (DRAPES) ×2 IMPLANT
ELECT PENCIL ROCKER SW 15FT (MISCELLANEOUS) ×1 IMPLANT
ELECT REM PT RETURN 15FT ADLT (MISCELLANEOUS) ×2 IMPLANT
GAUZE SPONGE 4X4 12PLY STRL (GAUZE/BANDAGES/DRESSINGS) IMPLANT
GLOVE BIO SURGEON STRL SZ 6 (GLOVE) ×2 IMPLANT
GLOVE INDICATOR 6.5 STRL GRN (GLOVE) ×2 IMPLANT
GLOVE SS BIOGEL STRL SZ 6 (GLOVE) ×1 IMPLANT
GLOVE SUPERSENSE BIOGEL SZ 6 (GLOVE) ×1
GOWN STRL REUS W/ TWL LRG LVL3 (GOWN DISPOSABLE) ×1 IMPLANT
GOWN STRL REUS W/ TWL XL LVL3 (GOWN DISPOSABLE) IMPLANT
GOWN STRL REUS W/TWL LRG LVL3 (GOWN DISPOSABLE) ×1
GOWN STRL REUS W/TWL XL LVL3 (GOWN DISPOSABLE)
KIT BASIN OR (CUSTOM PROCEDURE TRAY) ×2 IMPLANT
KIT TURNOVER KIT A (KITS) IMPLANT
MARKER SKIN DUAL TIP RULER LAB (MISCELLANEOUS) ×2 IMPLANT
MESH ULTRAPRO 3X6 7.6X15CM (Mesh General) ×1 IMPLANT
NEEDLE HYPO 22GX1.5 SAFETY (NEEDLE) ×2 IMPLANT
PACK BASIC VI WITH GOWN DISP (CUSTOM PROCEDURE TRAY) ×2 IMPLANT
PENCIL SMOKE EVACUATOR (MISCELLANEOUS) IMPLANT
SPIKE FLUID TRANSFER (MISCELLANEOUS) ×2 IMPLANT
SPONGE T-LAP 4X18 ~~LOC~~+RFID (SPONGE) ×2 IMPLANT
STRIP CLOSURE SKIN 1/2X4 (GAUZE/BANDAGES/DRESSINGS) ×1 IMPLANT
SUT ETHIBOND 0 MO6 C/R (SUTURE) ×2 IMPLANT
SUT MNCRL AB 4-0 PS2 18 (SUTURE) ×2 IMPLANT
SUT PDS AB 0 CT1 36 (SUTURE) ×4 IMPLANT
SUT SILK 3 0 (SUTURE) ×1
SUT SILK 3-0 18XBRD TIE 12 (SUTURE) ×1 IMPLANT
SUT VIC AB 3-0 SH 27 (SUTURE) ×2
SUT VIC AB 3-0 SH 27XBRD (SUTURE) ×2 IMPLANT
SUT VICRYL 0 UR6 27IN ABS (SUTURE) IMPLANT
SUT VICRYL 3 0 BR 18  UND (SUTURE)
SUT VICRYL 3 0 BR 18 UND (SUTURE) ×1 IMPLANT
SYR CONTROL 10ML LL (SYRINGE) ×2 IMPLANT
TOWEL OR 17X26 10 PK STRL BLUE (TOWEL DISPOSABLE) ×2 IMPLANT
TOWEL OR NON WOVEN STRL DISP B (DISPOSABLE) ×2 IMPLANT
TRAY FOLEY MTR SLVR 16FR STAT (SET/KITS/TRAYS/PACK) IMPLANT

## 2022-04-26 NOTE — Transfer of Care (Signed)
Immediate Anesthesia Transfer of Care Note  Patient: Tara Long  Procedure(s) Performed: OPEN LEFT INGUINAL HERNIA REPAIR WITH MESH (Left)  Patient Location: PACU  Anesthesia Type:General  Level of Consciousness: awake, alert  and patient cooperative  Airway & Oxygen Therapy: Patient Spontanous Breathing and Patient connected to face mask oxygen  Post-op Assessment: Report given to RN and Post -op Vital signs reviewed and stable  Post vital signs: Reviewed and stable  Last Vitals:  Vitals Value Taken Time  BP 142/80 04/26/22 0852  Temp    Pulse 83 04/26/22 0854  Resp 18 04/26/22 0854  SpO2 100 % 04/26/22 0854  Vitals shown include unvalidated device data.  Last Pain:  Vitals:   04/26/22 0556  TempSrc: Oral  PainSc:          Complications: No notable events documented.

## 2022-04-26 NOTE — Op Note (Signed)
Operative Note  Tara Long  [id number]  [id number]  04/26/2022   Surgeon: Phylliss Blakes MD FACS   Procedure performed: Open inguinal hernia repair with UltraPro mesh: right, indirect   Preop diagnosis:  right inguinal hernia   Post-op diagnosis/intraop findings: indirect inguinal hernia   Specimens: none   EBL: 5cc   Complications: none   Description of procedure: After confirming informed consent, the patient was taken to the operating room and placed supine on operating room table where general anesthesia was initiated, preoperative antibiotics were administered, SCDs applied, and a formal timeout was performed. The groin was clipped, prepped and draped in the usual sterile fashion. An oblique incision was made in the just above the inguinal ligament after infiltrating the tissues with local anesthetic (exparel mixed with 0.25% marcaine with epinephrine). Soft tissues were dissected using electrocautery until the external oblique aponeurosis was encountered. This was divided sharply to expand the external ring. A plane was bluntly developed between the hernia sac/round ligament and the external oblique. The round ligament was attenuated and this was divided with cautery. Inspection of the inguinal anatomy revealed a moderate indirect hernia sac. This was skeletonized to the level of the internal ring where it was reduced intact into the abdomen. The inguinal floor was reconstructed with interrupted figure of eight 0 PDS sutures. A 3 x 6 piece of ultra Pro mesh was brought onto the field and trimmed to approximate the field. This was sutured to the pubic tubercle fascia, inferior shelving edge and to the internal oblique superiorly with interrupted 0 ethibonds. The mesh was additionally sutured to the inguinal floor repair and the lateral aspect was directed flush beneath the external oblique aponeurosis. Hemostasis was ensured within the wound. The external oblique aponeurosis was  reapproximated with a running 3-0 Vicryl to re-create a narrowed external ring. More local was infiltrated around the pubic tubercle and in the plane just below the external oblique. The Scarpa's was reapproximated with interrupted 3-0 Vicryls. The skin was closed with a running subcuticular 4-0 Monocryl. The remainder of the local was injected in the subcutaneous and subcuticular space. The field was then cleaned, dermabond was applied. The patient was then awakened extubated and taken to PACU in stable condition.    All counts were correct at the completion of the case

## 2022-04-26 NOTE — Anesthesia Postprocedure Evaluation (Signed)
Anesthesia Post Note  Patient: Tara Long  Procedure(s) Performed: OPEN LEFT INGUINAL HERNIA REPAIR WITH MESH (Left)     Patient location during evaluation: PACU Anesthesia Type: General Level of consciousness: awake and alert Pain management: pain level controlled Vital Signs Assessment: post-procedure vital signs reviewed and stable Respiratory status: spontaneous breathing, nonlabored ventilation, respiratory function stable and patient connected to nasal cannula oxygen Cardiovascular status: blood pressure returned to baseline and stable Postop Assessment: no apparent nausea or vomiting Anesthetic complications: no   No notable events documented.  Last Vitals:  Vitals:   04/26/22 0943 04/26/22 0945  BP: 135/89 135/89  Pulse: 75   Resp: 10   Temp: 36.4 C   SpO2: 92%     Last Pain:  Vitals:   04/26/22 1005  TempSrc:   PainSc: 3                  Trevor Iha

## 2022-04-26 NOTE — Anesthesia Procedure Notes (Signed)
Procedure Name: LMA Insertion Date/Time: 04/26/2022 7:38 AM  Performed by: Wynonia Sours, CRNAPre-anesthesia Checklist: Patient identified, Emergency Drugs available, Suction available, Patient being monitored and Timeout performed Patient Re-evaluated:Patient Re-evaluated prior to induction Oxygen Delivery Method: Circle system utilized Preoxygenation: Pre-oxygenation with 100% oxygen Induction Type: IV induction LMA: LMA with gastric port inserted LMA Size: 4.0 Number of attempts: 1 Placement Confirmation: positive ETCO2 Tube secured with: Tape Dental Injury: Teeth and Oropharynx as per pre-operative assessment

## 2022-04-26 NOTE — H&P (Signed)
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  She noted a suprapubic mass in the left lower quadrant which was varying in size but without pain.  She first noted this in September.  She thinks it may have gotten slightly larger.  It has always been reducible.  She is essentially asymptomatic from this.   Ultrasound done last month describes a fat-containing left inguinal hernia.   Reports abdominal hysterectomy as her only previous surgery.   She lives in a senior community at [place], she does live alone.  Her daughter lives in [place].   Review of Systems: A complete review of systems was obtained from the patient.  I have reviewed this information and discussed as appropriate with the patient.  See HPI as well for other ROS.     Medical History:     Past Medical History:  Diagnosis Date   Arthritis     DVT (deep venous thrombosis) (CMS-HCC)     GERD (gastroesophageal reflux disease)        There is no problem list on file for this patient.          Past Surgical History:  Procedure Laterality Date   CATARACT EXTRACTION       HYSTERECTOMY VAGINAL       REPLACEMENT TOTAL KNEE          No Known Allergies         Current Outpatient Medications on File Prior to Visit  Medication Sig Dispense Refill   rosuvastatin (CRESTOR) 10 MG tablet         XARELTO 20 mg tablet          No current facility-administered medications on file prior to visit.           Family History  Problem Relation Age of Onset   Breast cancer Mother     High blood pressure (Hypertension) Mother        Social History        Tobacco Use  Smoking Status Never  Smokeless Tobacco Never      Social History        Socioeconomic History   Marital status: Widowed  Tobacco Use   Smoking status: Never   Smokeless tobacco: Never  Substance and Sexual Activity   Alcohol use: Yes   Drug use: Never      Objective:         Vitals:    03/06/22 1436  BP: 120/60  Pulse: 105  Temp: 36.7 C (98 F)  SpO2: 96%  Weight: 77.1 kg (170 lb)  Height: 160 cm (5\' 3" )    Body mass index is 30.11 kg/m.   Alert, well-appearing Unlabored respirations Abdomen soft and nontender.  There is a reducible left inguinal hernia which is nontender without overlying skin change   Assessment and Plan:  Diagnoses and all orders for this visit:   Non-recurrent unilateral inguinal hernia without obstruction or gangrene     We discussed the options for repair eluding open and laparoscopic/robotic, as well as the option of ongoing observation.  We discussed the relative merits and risks of each of  these options.  For this nonrecurrent unilateral hernia I recommended open repair. We discussed the relevant anatomy and we discussed the technique of the procedure.  Discussed risks of bleeding, infection, pain, scarring, injury to structures in the area including nerves, blood vessels, bowel, bladder, risk of chronic pain, hernia recurrence, risk of seroma or hematoma, urinary retention, and risks of general anesthesia including cardiovascular, pulmonary, and thromboembolic complications.  Questions were answered.  Patient wishes to proceed with scheduling.     Damarco Keysor Carlye Grippe, MD

## 2022-04-26 NOTE — Discharge Instructions (Addendum)
HERNIA REPAIR: POST OP INSTRUCTIONS   EAT Gradually transition to a high fiber diet with a fiber supplement over the next few weeks after discharge.  Start with a pureed / full liquid diet (see below)  WALK Walk an hour a day (cumulative- not all at once).  Control your pain to do that.    CONTROL PAIN Control pain so that you can walk, sleep, tolerate sneezing/coughing, and go up/down stairs.  HAVE A BOWEL MOVEMENT DAILY Keep your bowels regular to avoid problems.  OK to try a laxative to override constipation.  OK to use an antidairrheal to slow down diarrhea.  Call if not better after 2 tries  CALL IF YOU HAVE PROBLEMS/CONCERNS Call if you are still struggling despite following these instructions. Call if you have concerns not answered by these instructions  ######################################################################    DIET: Follow a light bland diet & liquids the first 24 hours after arrival home, such as soup, liquids, starches, etc.  Be sure to drink plenty of fluids.  Quickly advance to a usual solid diet within a few days.  Avoid fast food or heavy meals as your are more likely to get nauseated or have irregular bowels.  A low-sugar, high-fiber diet for the rest of your life is ideal.   Take your usually prescribed home medications unless otherwise directed.  PAIN CONTROL: Pain is best controlled by a usual combination of three different methods TOGETHER: Ice/Heat Over the counter pain medication Prescription pain medication Most patients will experience some swelling and bruising around the hernia(s) such as the bellybutton, groins, or old incisions.  Ice packs or heating pads (30-60 minutes up to 6 times a day) will help. Use ice for the first few days to help decrease swelling and bruising, then switch to heat to help relax tight/sore spots and speed recovery.  Some people prefer to use ice alone, heat alone, alternating between ice & heat.  Experiment to what  works for you.  Swelling and bruising can take several weeks to resolve.   It is helpful to take an over-the-counter pain medication regularly for the first days: Naproxen (Aleve, etc)  Two 220mg tabs twice a day OR Ibuprofen (Advil, etc) Three 200mg tabs four times a day (every meal & bedtime) AND Acetaminophen (Tylenol, etc) 325-650mg four times a day (every meal & bedtime) A  prescription for pain medication should be given to you upon discharge.  Take your pain medication as prescribed, IF NEEDED.  If you are having problems/concerns with the prescription medicine (does not control pain, nausea, vomiting, rash, itching, etc), please call us [phone] to see if we need to switch you to a different pain medicine that will work better for you and/or control your side effect better. If you need a refill on your pain medication, please contact your pharmacy.  They will contact our office to request authorization. Prescriptions will not be filled after 5 pm or on week-ends.  Avoid getting constipated.  Between the surgery and the pain medications, it is common to experience some constipation.  Increasing fluid intake and taking a fiber supplement (such as Metamucil, Citrucel, FiberCon, MiraLax, etc) 1-2 times a day regularly will usually help prevent this problem from occurring.  A mild laxative (prune juice, Milk of Magnesia, MiraLax, etc) should be taken according to package directions if there are no bowel movements after 48 hours.    Wash / shower every day, starting 2 days after surgery.  You may shower over the   skin glue which is waterproof.    Glue will flake off after about 2 weeks. You may leave the incision open to air.  You may replace a dressing/Band-Aid to cover an incision for comfort if you wish.  Continue to shower over incision(s) after the dressing is off.  ACTIVITIES as tolerated:   You may resume regular (light) daily activities beginning the next day--such as daily self-care,  walking, climbing stairs--gradually increasing activities as tolerated.  Control your pain so that you can walk an hour a day.  If you can walk 30 minutes without difficulty, it is safe to try more intense activity such as jogging, treadmill, bicycling, low-impact aerobics, swimming, etc. Refrain from the most intensive and strenuous activity such as sit-ups, heavy lifting, contact sports, etc  Refrain from any heavy lifting or straining until 6 weeks after surgery.   DO NOT PUSH THROUGH PAIN.  Let pain be your guide: If it hurts to do something, don't do it.  Pain is your body warning you to avoid that activity for another week until the pain goes down. You may drive when you are no longer taking prescription pain medication, you can comfortably wear a seatbelt, and you can safely maneuver your car and apply brakes. You may have sexual intercourse when it is comfortable.   FOLLOW UP in our office Please call [facility] at [phone] to set up an appointment to see your surgeon in the office for a follow-up appointment approximately 2-3 weeks after your surgery. Make sure that you call for this appointment the day you arrive home to insure a convenient appointment time.  9.  If you have disability of FMLA / Family leave forms, please bring the forms to the office for processing.  (do not give to your surgeon).  WHEN TO CALL us [phone]: Poor pain control Reactions / problems with new medications (rash/itching, nausea, etc)  Fever over 101.5 F (38.5 C) Inability to urinate Nausea and/or vomiting Worsening swelling or bruising Continued bleeding from incision. Increased pain, redness, or drainage from the incision   The clinic staff is available to answer your questions during regular business hours (8:30am-5pm).  Please don't hesitate to call and ask to speak to one of our nurses for clinical concerns.   If you have a medical emergency, go to the nearest emergency room or call 911.  A  surgeon from [facility] is always on call at the hospitals in [facility], [place] [address], [place], [place]  [address] ?  [address] [address], [place], [place]   [address] MAIN: [id number] ? TOLL FREE: [phone] ? FAX: [phone] [email or web address]

## 2022-04-29 ENCOUNTER — Encounter (HOSPITAL_COMMUNITY): Payer: Self-pay | Admitting: Surgery

## 2022-07-11 DIAGNOSIS — I2699 Other pulmonary embolism without acute cor pulmonale: Secondary | ICD-10-CM | POA: Diagnosis not present

## 2022-07-11 DIAGNOSIS — Z0000 Encounter for general adult medical examination without abnormal findings: Secondary | ICD-10-CM | POA: Diagnosis not present

## 2022-07-11 DIAGNOSIS — E7800 Pure hypercholesterolemia, unspecified: Secondary | ICD-10-CM | POA: Diagnosis not present

## 2022-07-11 DIAGNOSIS — R002 Palpitations: Secondary | ICD-10-CM | POA: Diagnosis not present

## 2022-07-11 DIAGNOSIS — M8589 Other specified disorders of bone density and structure, multiple sites: Secondary | ICD-10-CM | POA: Diagnosis not present

## 2022-07-12 ENCOUNTER — Other Ambulatory Visit: Payer: Self-pay | Admitting: Family Medicine

## 2022-07-12 DIAGNOSIS — M8580 Other specified disorders of bone density and structure, unspecified site: Secondary | ICD-10-CM

## 2022-09-10 ENCOUNTER — Ambulatory Visit (INDEPENDENT_AMBULATORY_CARE_PROVIDER_SITE_OTHER): Payer: Medicare Other

## 2022-09-10 ENCOUNTER — Ambulatory Visit: Payer: Medicare Other | Attending: Internal Medicine | Admitting: Internal Medicine

## 2022-09-10 ENCOUNTER — Encounter: Payer: Self-pay | Admitting: Internal Medicine

## 2022-09-10 VITALS — BP 126/72 | HR 103 | Ht 63.0 in | Wt 172.2 lb

## 2022-09-10 DIAGNOSIS — Z7901 Long term (current) use of anticoagulants: Secondary | ICD-10-CM | POA: Diagnosis not present

## 2022-09-10 DIAGNOSIS — Z86718 Personal history of other venous thrombosis and embolism: Secondary | ICD-10-CM

## 2022-09-10 DIAGNOSIS — R002 Palpitations: Secondary | ICD-10-CM | POA: Diagnosis not present

## 2022-09-10 NOTE — Progress Notes (Unsigned)
Enrolled for [employer] to mail a ZIO XT long term holter monitor to the patients address on file.  

## 2022-09-10 NOTE — Progress Notes (Signed)
OFFICE CONSULT NOTE  Chief Complaint:  History of PE/palpitations  Primary Care Physician: Shon Hale, MD  HPI:  Tara Long is a 81 y.o. female who is being seen today for the evaluation of PE/palpitations at the request of Shon Hale, *.  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  Prior to that she had no significant past medical history.  In fact there is no history of clotting in her family.  She had no surgery, stasis or other provoking episodes before this.  She denies any injuries to her legs.  She is a non-smoker.  No known cancer or malignancies.  She said she developed some notable swelling of her right lower extremity.  In the hospital the Dopplers indicated an acute deep vein thrombosis involving the right popliteal vein, right posterior tibial veins and right peroneal veins.  CT scan of the chest showed acute bilateral pulmonary emboli worse on the left than the right without evidence of right heart strain.  An echocardiogram was performed which function with EF of 60 to 65% and grade 1 diastolic dysfunction.  No evidence of right heart strain.  Subsequently after her initial treatment dose of Xarelto she is maintained on 20 mg daily for the past year.  She was I believe referred for evaluation of this plus the fact that she has been having some palpitations.  She says she notes occasional fluttering of her heart which occurs perhaps every few months and is very sharp self-limited.  No associated chest pain, no syncope, presyncope or other associated symptoms.  09/10/2022  Tara Long returns today for follow-up.  She reports recently she has been having an increase in palpitations.  She says they occur nearly on a daily basis.  She had these previously and I was concerned that they might be A-fib however we never ended up monitoring her.  She has however been on the full dose of Xarelto for  coverage for this since she had unprovoked DVT/PE.  She has done well with this.  She did interrupted this year for hernia surgery which was successful back in June.  She has had no stroke that were aware of.  PMHx:  Past Medical History:  Diagnosis Date   DVT (deep venous thrombosis) (HCC) 2021   H/O knee surgery    H/O: hysterectomy    Pulmonary embolism (HCC) 2021    Past Surgical History:  Procedure Laterality Date   ABDOMINAL HYSTERECTOMY     age 80   COLONOSCOPY     FOOT SURGERY Right    age 78   INGUINAL HERNIA REPAIR Left 04/26/2022   Procedure: OPEN LEFT INGUINAL HERNIA REPAIR WITH MESH;  Surgeon: Berna Bue, MD;  Location: [facility];  Service: General;  Laterality: Left;   KNEE ARTHROPLASTY Right 2012   KNEE ARTHROPLASTY  2013    FAMHx:  Family History  Problem Relation Age of Onset   Breast cancer Mother    Hypertension Mother    Hypertension Maternal Grandmother     SOCHx:   reports that she has never smoked. She has never used smokeless tobacco. She reports that she does not use drugs. No history on file for alcohol use.  ALLERGIES:  No Known Allergies  ROS: Pertinent items noted in HPI and remainder of comprehensive ROS otherwise negative.  HOME MEDS: Current Outpatient Medications on File Prior to Visit  Medication Sig Dispense  Refill   levothyroxine (SYNTHROID) 25 MCG tablet Take 25 mcg by mouth daily.     Multiple Vitamin (MULTIVITAMIN WITH MINERALS) TABS tablet Take 1 tablet by mouth in the morning. Centrum for Women     rivaroxaban (XARELTO) 20 MG TABS tablet Take 1 tablet (20 mg total) by mouth daily with supper. (Patient taking differently: Take 20 mg by mouth daily after breakfast.) 90 tablet 3   No current facility-administered medications on file prior to visit.    LABS/IMAGING: No results found for this or any previous visit (from the past 48 hour(s)). No results found.  LIPID PANEL: No results found for: "CHOL", "TRIG", "HDL",  "CHOLHDL", "VLDL", "LDLCALC", "LDLDIRECT"  WEIGHTS: Wt Readings from Last 3 Encounters:  09/10/22 172 lb 3.2 oz (78.1 kg)  04/26/22 164 lb 14.5 oz (74.8 kg)  04/16/22 165 lb (74.8 kg)    VITALS: BP 126/72   Pulse (!) 103   Ht 5\' 3"  (1.6 m)   Wt 172 lb 3.2 oz (78.1 kg)   SpO2 98%   BMI 30.50 kg/m   EXAM: General appearance: alert and no distress Neck: no carotid bruit, no JVD and thyroid not enlarged, symmetric, no tenderness/mass/nodules Lungs: clear to auscultation bilaterally Heart: regular rate and rhythm, S1, S2 normal, no murmur, click, rub or gallop Abdomen: soft, non-tender; bowel sounds normal; no masses,  no organomegaly Extremities: extremities normal, atraumatic, no cyanosis or edema Pulses: 2+ and symmetric Skin: Skin color, texture, turgor normal. 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   Tara Long has been having an increase in palpitations which is concerning for possible paroxysmal atrial fibrillation.  She has baseline sinus tachycardia which was noted today.  I like to place a 2-week ZIO monitor.  She should remain on the 20 mg Xarelto for coverage of her DVT and PE which is spontaneous.  She would like to avoid medications if possible.  I advised her to cut back on her tea by changing it to decaf.  This is at least daily for her.  She is physically active.  She sleeps well and has low stress and has no snoring or concerns for apnea.  She already has a scheduled follow-up with me in January which we will keep to follow-up on her monitor.  Tara Casino, MD, [facility], [place] Director of the Advanced Lipid Disorders &  Cardiovascular Risk Reduction Clinic Diplomate of the American Board of Clinical Lipidology Attending Cardiologist  Direct Dial: [phone]  Fax:  [phone]  Website:  [email or web address] 09/10/2022, 9:29 AM

## 2022-09-10 NOTE — Patient Instructions (Signed)
Medication Instructions:  NO CHANGES  *If you need a refill on your cardiac medications before your next appointment, please call your pharmacy*   Testing/Procedures: [place] Monitor Instructions  Your physician has requested you wear a ZIO patch monitor for 14 days.  This is a single patch monitor. [employer] supplies one patch monitor per enrollment. Additional stickers are not available. Please do not apply patch if you will be having a Nuclear Stress Test,  Echocardiogram, Cardiac CT, MRI, or Chest Xray during the period you would be wearing the  monitor. The patch cannot be worn during these tests. You cannot remove and re-apply the  ZIO XT patch monitor.  Your ZIO patch monitor will be mailed 3 day USPS to your address on file. It may take 3-5 days  to receive your monitor after you have been enrolled.  Once you have received your monitor, please review the enclosed instructions. Your monitor  has already been registered assigning a specific monitor serial # to you.  Billing and Patient Assistance Program Information  We have supplied [employer] with any of your insurance information on file for billing purposes. [employer] offers a sliding scale Patient Assistance Program for patients that do not have  insurance, or whose insurance does not completely cover the cost of the ZIO monitor.  You must apply for the Patient Assistance Program to qualify for this discounted rate.  To apply, please call [employer] at [phone], select option 4, select option 2, ask to apply for  Patient Assistance Program. Theodore Demark will ask your household income, and how many people  are in your household. They will quote your out-of-pocket cost based on that information.  [employer] will also be able to set up a 79-month, interest-free payment plan if needed.  Applying the monitor   Shave hair from upper left chest.  Hold abrader disc by orange tab. Rub abrader in 40 strokes over the upper left chest as   indicated in your monitor instructions.  Clean area with 4 enclosed alcohol pads. Let dry.  Apply patch as indicated in monitor instructions. Patch will be placed under collarbone on left  side of chest with arrow pointing upward.  Rub patch adhesive wings for 2 minutes. Remove white label marked "1". Remove the white  label marked "2". Rub patch adhesive wings for 2 additional minutes.  While looking in a mirror, press and release button in center of patch. A small green light will  flash 3-4 times. This will be your only indicator that the monitor has been turned on.  Do not shower for the first 24 hours. You may shower after the first 24 hours.  Press the button if you feel a symptom. You will hear a small click. Record Date, Time and  Symptom in the Patient Logbook.  When you are ready to remove the patch, follow instructions on the last 2 pages of Patient  Logbook. Stick patch monitor onto the last page of Patient Logbook.  Place Patient Logbook in the blue and white box. Use locking tab on box and tape box closed  securely. The blue and white box has prepaid postage on it. Please place it in the mailbox as  soon as possible. Your physician should have your test results approximately 7 days after the  monitor has been mailed back to [facility].  Call [facility] at [phone] if you have questions regarding  your ZIO XT patch monitor. Call them immediately if you see an orange  light blinking on your  monitor.  If your monitor falls off in less than 4 days, contact our Monitor department at [phone].  If your monitor becomes loose or falls off after 4 days call [employer] at [phone] for  suggestions on securing your monitor    Follow-Up: At [facility], you and your health needs are our priority.  As part of our continuing mission to provide you with exceptional heart care, we have created designated Provider Care Teams.  These Care Teams  include your primary Cardiologist (physician) and Advanced Practice Providers (APPs -  Physician Assistants and Nurse Practitioners) who all work together to provide you with the care you need, when you need it.  We recommend signing up for the patient portal called "MyChart".  Sign up information is provided on this After Visit Summary.  MyChart is used to connect with patients for Virtual Visits (Telemedicine).  Patients are able to view lab/test results, encounter notes, upcoming appointments, etc.  Non-urgent messages can be sent to your provider as well.   To learn more about what you can do with MyChart, go to [email or web address].    Your next appointment:    January 2024 as scheduled

## 2022-09-13 DIAGNOSIS — R002 Palpitations: Secondary | ICD-10-CM | POA: Diagnosis not present

## 2022-09-13 DIAGNOSIS — R946 Abnormal results of thyroid function studies: Secondary | ICD-10-CM | POA: Diagnosis not present

## 2022-09-13 DIAGNOSIS — M79671 Pain in right foot: Secondary | ICD-10-CM | POA: Diagnosis not present

## 2022-09-13 DIAGNOSIS — R7989 Other specified abnormal findings of blood chemistry: Secondary | ICD-10-CM | POA: Diagnosis not present

## 2022-09-30 IMAGING — US US PELVIS LIMITED
1 series · 5 of 5 positions shown · non-contrast
Comparison: None.

CLINICAL DATA: Suprapubic mass.  Bulging the left groin.

EXAM:
US PELVIS LIMITED
TECHNIQUE: Sonographic imaging of the left groin.

[Series 1: us pelvis limited · 5 acquisitions, 5 frames shown]
[im 1/5]
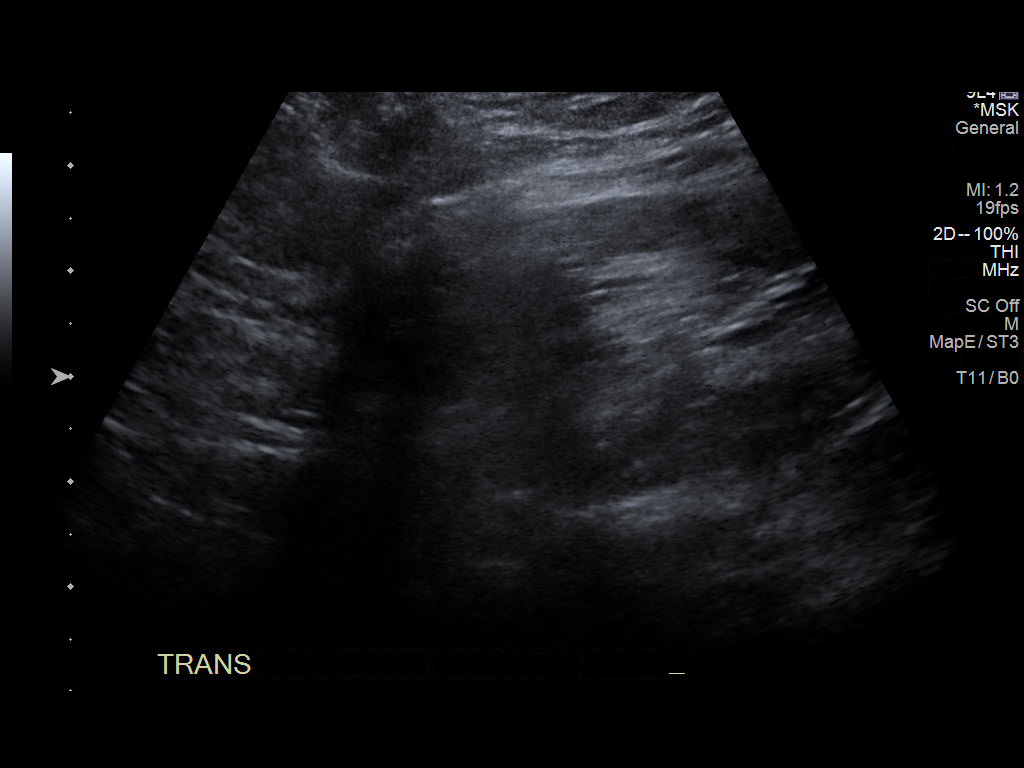
[im 2/5]
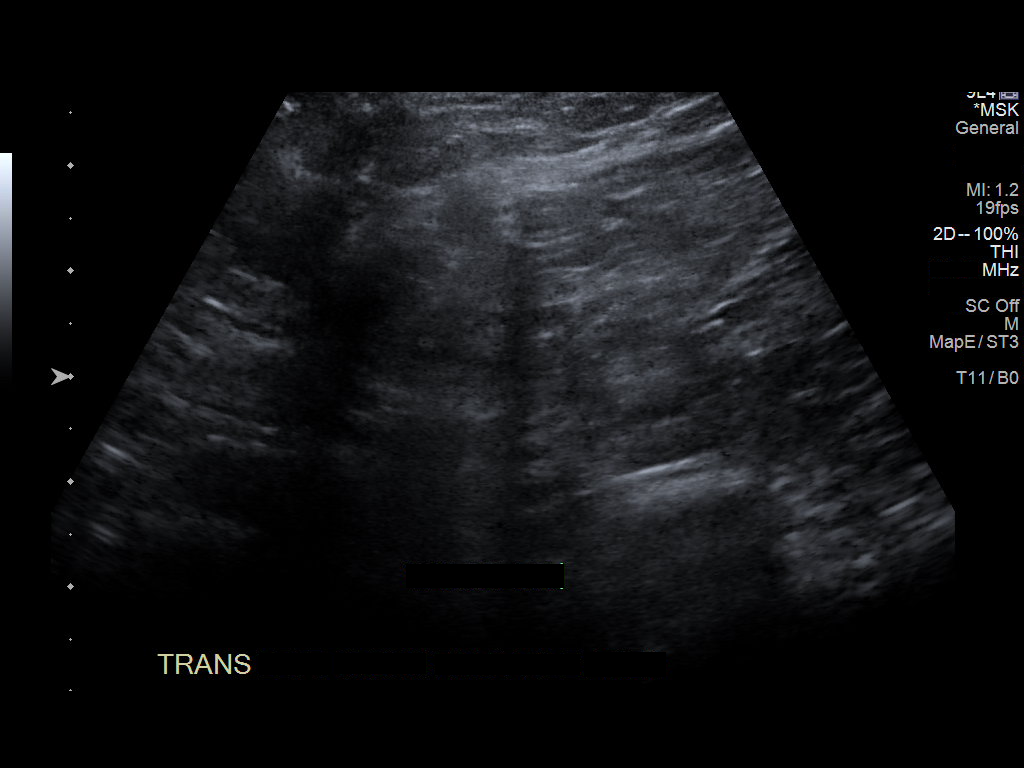
[im 3/5]
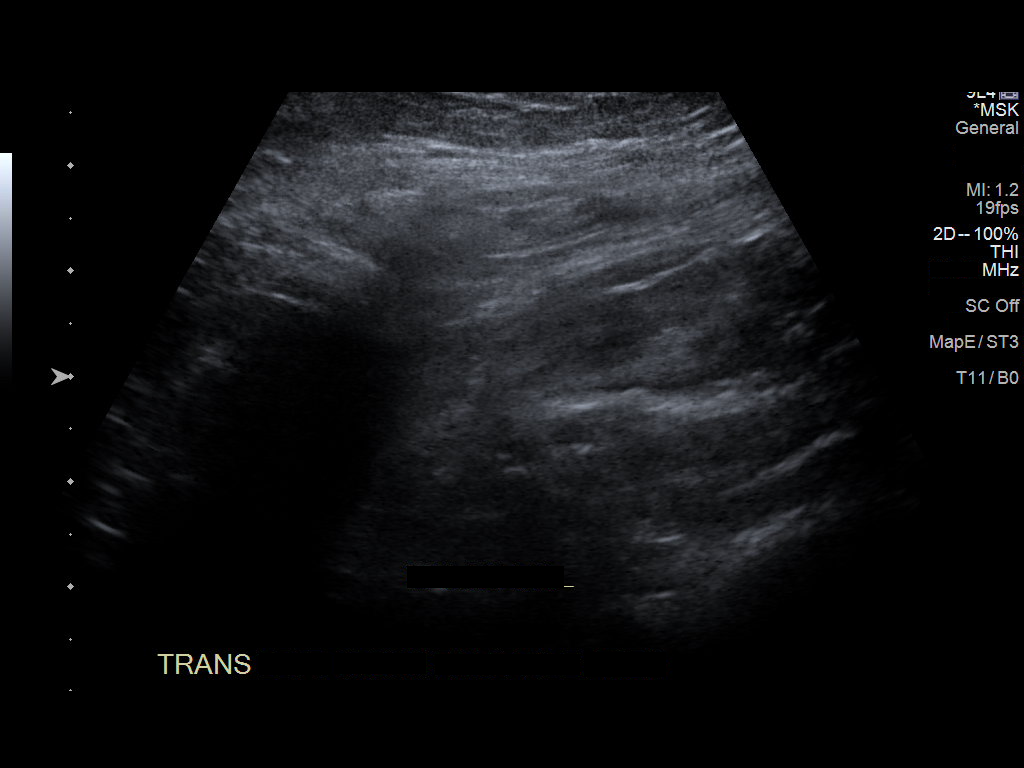
[im 4/5]
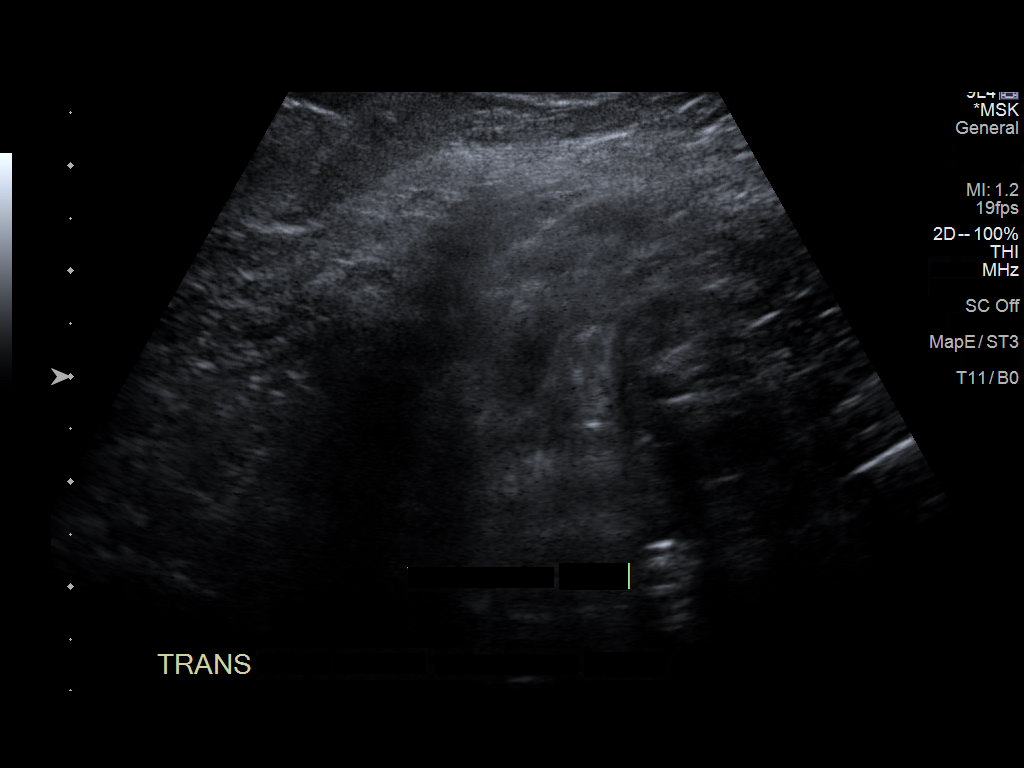
[im 5/5]
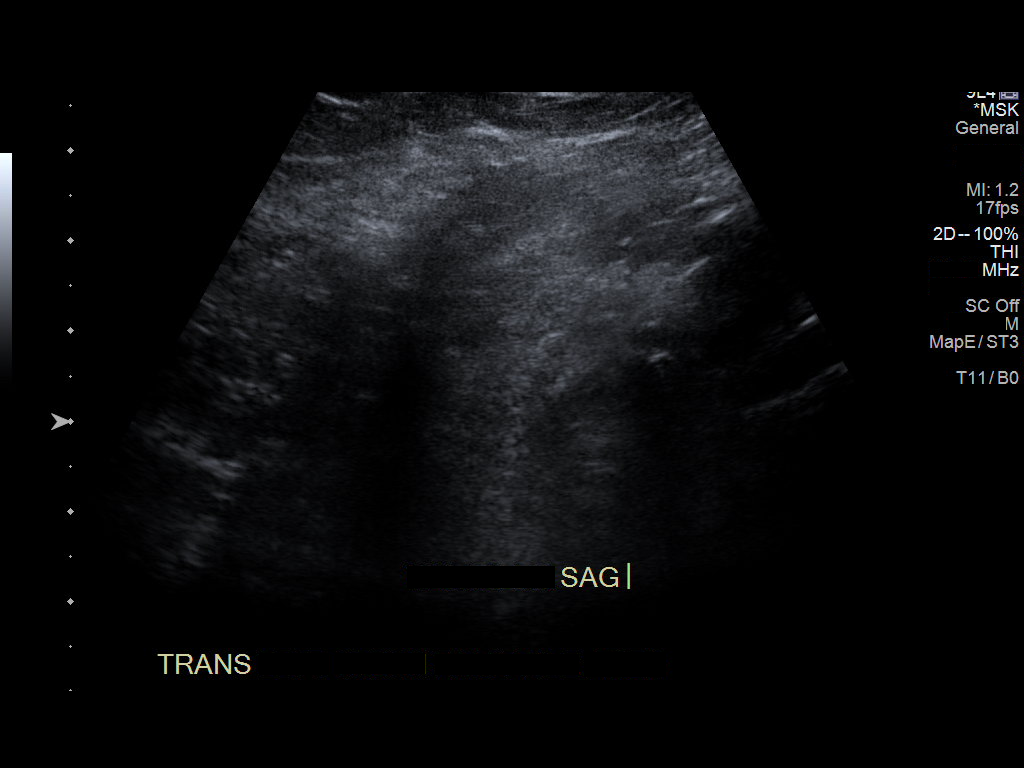

[5 of 5 positions shown; findings below may reference images not displayed]

FINDINGS: Within the region of palpable abnormality in the left groin, a
hernia is identified containing mesenteric fat. No definite bowel
identified within the hernia. No evidence for cyst or soft tissue.
IMPRESSION: 1. Fat containing left inguinal hernia.

## 2022-10-08 DIAGNOSIS — R002 Palpitations: Secondary | ICD-10-CM | POA: Diagnosis not present

## 2022-10-11 ENCOUNTER — Other Ambulatory Visit: Payer: Self-pay | Admitting: *Deleted

## 2022-10-11 MED ORDER — METOPROLOL SUCCINATE ER 25 MG PO TB24: 25.0000 mg | ORAL_TABLET | Freq: Every day | ORAL | 3 refills | Status: DC

## 2022-10-16 DIAGNOSIS — Z5181 Encounter for therapeutic drug level monitoring: Secondary | ICD-10-CM | POA: Diagnosis not present

## 2022-10-16 DIAGNOSIS — R946 Abnormal results of thyroid function studies: Secondary | ICD-10-CM | POA: Diagnosis not present

## 2022-11-27 ENCOUNTER — Ambulatory Visit: Payer: Medicare Other | Admitting: Internal Medicine

## 2022-11-29 DIAGNOSIS — U071 COVID-19: Secondary | ICD-10-CM | POA: Diagnosis not present

## 2022-11-29 DIAGNOSIS — R5383 Other fatigue: Secondary | ICD-10-CM | POA: Diagnosis not present

## 2022-12-16 ENCOUNTER — Encounter (HOSPITAL_BASED_OUTPATIENT_CLINIC_OR_DEPARTMENT_OTHER): Payer: Self-pay | Admitting: Internal Medicine

## 2022-12-16 ENCOUNTER — Encounter (HOSPITAL_BASED_OUTPATIENT_CLINIC_OR_DEPARTMENT_OTHER): Payer: Self-pay

## 2022-12-16 ENCOUNTER — Ambulatory Visit (HOSPITAL_BASED_OUTPATIENT_CLINIC_OR_DEPARTMENT_OTHER): Payer: Medicare Other | Admitting: Internal Medicine

## 2022-12-16 ENCOUNTER — Ambulatory Visit (HOSPITAL_BASED_OUTPATIENT_CLINIC_OR_DEPARTMENT_OTHER)
Admission: RE | Admit: 2022-12-16 | Discharge: 2022-12-16 | Disposition: A | Discharge status: Home / Self Care | Payer: Medicare Other | Source: Ambulatory Visit | Attending: Internal Medicine | Admitting: Internal Medicine

## 2022-12-16 VITALS — BP 120/70 | HR 113 | Ht 61.0 in | Wt 169.2 lb

## 2022-12-16 DIAGNOSIS — R0602 Shortness of breath: Secondary | ICD-10-CM

## 2022-12-16 DIAGNOSIS — R002 Palpitations: Secondary | ICD-10-CM

## 2022-12-16 DIAGNOSIS — R9431 Abnormal electrocardiogram [ECG] [EKG]: Secondary | ICD-10-CM

## 2022-12-16 DIAGNOSIS — R000 Tachycardia, unspecified: Secondary | ICD-10-CM | POA: Diagnosis not present

## 2022-12-16 DIAGNOSIS — Z86718 Personal history of other venous thrombosis and embolism: Secondary | ICD-10-CM

## 2022-12-16 DIAGNOSIS — K449 Diaphragmatic hernia without obstruction or gangrene: Secondary | ICD-10-CM | POA: Diagnosis not present

## 2022-12-16 LAB — POCT I-STAT CREATININE: Creatinine, Ser: 1.2 mg/dL — ABNORMAL HIGH (ref 0.44–1.00)

## 2022-12-16 MED ORDER — IOHEXOL 350 MG/ML SOLN
100.0000 mL | Freq: Once | INTRAVENOUS | Status: AC | PRN
  Administered 2022-12-16: 75 mL via INTRAVENOUS

## 2022-12-16 MED ORDER — METOPROLOL SUCCINATE ER 50 MG PO TB24: 50.0000 mg | ORAL_TABLET | Freq: Every day | ORAL | 3 refills | Status: AC

## 2022-12-16 NOTE — Addendum Note (Signed)
Addended by: Fidel Levy on: 12/16/2022 02:19 PM   Modules accepted: Orders

## 2022-12-16 NOTE — Progress Notes (Signed)
OFFICE CONSULT NOTE  Chief Complaint:  Follow-up palpitations  Primary Care Physician: Glenis Smoker, MD  HPI:  Tara Long is a 82 y.o. female who is being seen today for the evaluation of PE/palpitations at the request of Glenis Smoker, *.  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  Prior to that she had no significant past medical history.  In fact there is no history of clotting in her family.  She had no surgery, stasis or other provoking episodes before this.  She denies any injuries to her legs.  She is a non-smoker.  No known cancer or malignancies.  She said she developed some notable swelling of her right lower extremity.  In the hospital the Dopplers indicated an acute deep vein thrombosis involving the right popliteal vein, right posterior tibial veins and right peroneal veins.  CT scan of the chest showed acute bilateral pulmonary emboli worse on the left than the right without evidence of right heart strain.  An echocardiogram was performed which function with EF of 60 to 65% and grade 1 diastolic dysfunction.  No evidence of right heart strain.  Subsequently after her initial treatment dose of Xarelto she is maintained on 20 mg daily for the past year.  She was I believe referred for evaluation of this plus the fact that she has been having some palpitations.  She says she notes occasional fluttering of her heart which occurs perhaps every few months and is very sharp self-limited.  No associated chest pain, no syncope, presyncope or other associated symptoms.  09/10/2022  Tara Long returns today for follow-up.  She reports recently she has been having an increase in palpitations.  She says they occur nearly on a daily basis.  She had these previously and I was concerned that they might be A-fib however we never ended up monitoring her.  She has however been on the full dose of Xarelto for  coverage for this since she had unprovoked DVT/PE.  She has done well with this.  She did interrupted this year for hernia surgery which was successful back in June.  She has had no stroke that were aware of.  12/16/2022  Tara Long is seen today in follow-up. Her monitor showed numerous short episodes of SVT -based on this, I advised starting Toprol-XL 25 mg daily.  She says that she has not noted any significant difference in her palpitations/tachycardia on metoprolol and in fact her heart rate has elevated even more.  EKG was repeated today.  This shows some new inferior T wave inversions with a right bundle pattern as well as PVCs and sinus tachycardia.  Heart rate of 113.  There is also S1Q3T3 finding.  As mentioned she previously had unprovoked DVT/PE.  She says she has been compliant with her Xarelto.  PMHx:  Past Medical History:  Diagnosis Date   DVT (deep venous thrombosis) ([place]) 2021   H/O knee surgery    H/O: hysterectomy    Pulmonary embolism ([place]) 2021    Past Surgical History:  Procedure Laterality Date   ABDOMINAL HYSTERECTOMY     age 60   COLONOSCOPY     FOOT SURGERY Right    age 57   INGUINAL HERNIA REPAIR Left 04/26/2022   Procedure: OPEN LEFT INGUINAL HERNIA REPAIR WITH MESH;  Surgeon: Clovis Riley, MD;  Location: [facility];  Service: General;  Laterality: Left;  KNEE ARTHROPLASTY Right 2012   KNEE ARTHROPLASTY  2013    FAMHx:  Family History  Problem Relation Age of Onset   Breast cancer Mother    Hypertension Mother    Hypertension Maternal Grandmother     SOCHx:   reports that she has never smoked. She has never used smokeless tobacco. She reports that she does not use drugs. No history on file for alcohol use.  ALLERGIES:  No Known Allergies  ROS: Pertinent items noted in HPI and remainder of comprehensive ROS otherwise negative.  HOME MEDS: Current Outpatient Medications on File Prior to Visit  Medication Sig Dispense Refill   levothyroxine  (SYNTHROID) 25 MCG tablet Take 25 mcg by mouth daily.     metoprolol succinate (TOPROL XL) 25 MG 24 hr tablet Take 1 tablet (25 mg total) by mouth at bedtime. 90 tablet 3   Multiple Vitamin (MULTIVITAMIN WITH MINERALS) TABS tablet Take 1 tablet by mouth in the morning. Centrum for Women     rivaroxaban (XARELTO) 20 MG TABS tablet Take 1 tablet (20 mg total) by mouth daily with supper. 90 tablet 3   No current facility-administered medications on file prior to visit.    LABS/IMAGING: No results found for this or any previous visit (from the past 48 hour(s)). No results found.  LIPID PANEL: No results found for: "CHOL", "TRIG", "HDL", "CHOLHDL", "VLDL", "LDLCALC", "LDLDIRECT"  WEIGHTS: Wt Readings from Last 3 Encounters:  12/16/22 169 lb 3.2 oz (76.7 kg)  09/10/22 172 lb 3.2 oz (78.1 kg)  04/26/22 164 lb 14.5 oz (74.8 kg)    VITALS: BP 120/70   Pulse (!) 113   Ht 5\' 1"  (1.549 m)   Wt 169 lb 3.2 oz (76.7 kg)   SpO2 95%   BMI 31.97 kg/m   EXAM: General appearance: alert and no distress Neck: no carotid bruit, no JVD, and thyroid not enlarged, symmetric, no tenderness/mass/nodules Lungs: clear to auscultation bilaterally Heart: Regular tachycardia Abdomen: soft, non-tender; bowel sounds normal; no masses,  no organomegaly Extremities: extremities normal, atraumatic, no cyanosis or edema Pulses: 2+ and symmetric Skin: Skin color, texture, turgor normal. 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   [name] has had some progressive tachycardia as well as shortness of breath.  She has a history of unprovoked DVT and PE but has been on Xarelto.  Recently she has been having  frequent SVT episodes as well as PVCs and although I added a beta-blocker, her tachycardia seems to be worse.  She did have recent COVID-19 although has been vaccinated and says it was quite mild.  I am concerned about her EKG changes, particularly that they might represent some degree of heart strain and this could possibly be due to pulmonary emboli.  Although this is fairly unlikely because she is anticoagulated, I have seen this I would recommend that we do urgent imaging to rule out pulmonary embolus.  I discussed this with her today and she is in agreement and we will try to arrange that here at the [facility] facility today- if abnormal, then she will need to present to the ER to be heparinized and probably admitted.  If this testing is negative, then I would recommend an echocardiogram and stress test.  Plan increase  in her metoprolol XL to 50 mg daily.  Follow-up with me afterwards.  Pixie Casino, MD, [facility], [place] Director of the Advanced Lipid Disorders &  Cardiovascular Risk Reduction Clinic Diplomate of the American Board of Clinical Lipidology Attending Cardiologist  Direct Dial: [phone]  Fax: [phone]  Website:  [email or web address] 12/16/2022, 11:17 AM

## 2022-12-16 NOTE — Patient Instructions (Signed)
Medication Instructions:  INCREASE metoprolol succinate to 50mg  daily  *If you need a refill on your cardiac medications before your next appointment, please call your pharmacy*    Testing/Procedures: Your physician has requested that you have an echocardiogram. Echocardiography is a painless test that uses sound waves to create images of your heart. It provides your doctor with information about the size and shape of your heart and how well your heart's chambers and valves are working. This procedure takes approximately one hour. There are no restrictions for this procedure. Please do NOT wear cologne, perfume, aftershave, or lotions (deodorant is allowed). Please arrive 15 minutes prior to your appointment time.  Dr. Debara Pickett has ordered a Lexiscan Myocardial Perfusion Imaging Study.  Please arrive 15 minutes prior to your appointment time for registration and insurance purposes.   The test will take approximately 3 to 4 hours to complete; you may bring reading material.  If someone comes with you to your appointment, they will need to remain in the main lobby due to limited space in the testing area. **If you are pregnant or breastfeeding, please notify the nuclear lab prior to your appointment**   How to prepare for your Myocardial Perfusion Test: Do not eat or drink 3 hours prior to your test, except you may have water. Do not consume products containing caffeine (regular or decaffeinated) 12 hours prior to your test. (ex: coffee, chocolate, sodas, tea). Do wear comfortable clothes (no dresses or overalls) and walking shoes, tennis shoes preferred (No heels or open toe shoes are allowed). Do NOT wear cologne, perfume, aftershave, or lotions (deodorant is allowed). If you use an inhaler, use it the AM of your test and bring it with you.  If you use a nebulizer, use it the AM of your test.  If these instructions are not followed, your test will have to be rescheduled.    Follow-Up: At  [facility], you and your health needs are our priority.  As part of our continuing mission to provide you with exceptional heart care, we have created designated Provider Care Teams.  These Care Teams include your primary Cardiologist (physician) and Advanced Practice Providers (APPs -  Physician Assistants and Nurse Practitioners) who all work together to provide you with the care you need, when you need it.  We recommend signing up for the patient portal called "MyChart".  Sign up information is provided on this After Visit Summary.  MyChart is used to connect with patients for Virtual Visits (Telemedicine).  Patients are able to view lab/test results, encounter notes, upcoming appointments, etc.  Non-urgent messages can be sent to your provider as well.   To learn more about what you can do with MyChart, go to [email or web address].    Your next appointment:    AFTER TESTING with Dr. Debara Pickett or NP/PA

## 2022-12-16 NOTE — Addendum Note (Signed)
Addended by: Pixie Casino on: 12/16/2022 02:51 PM   Modules accepted: Orders

## 2023-01-08 ENCOUNTER — Other Ambulatory Visit: Payer: Medicare Other

## 2023-01-08 DIAGNOSIS — I471 Supraventricular tachycardia, unspecified: Secondary | ICD-10-CM | POA: Diagnosis not present

## 2023-01-08 DIAGNOSIS — D6869 Other thrombophilia: Secondary | ICD-10-CM | POA: Diagnosis not present

## 2023-01-08 DIAGNOSIS — I2699 Other pulmonary embolism without acute cor pulmonale: Secondary | ICD-10-CM | POA: Diagnosis not present

## 2023-01-08 DIAGNOSIS — Z23 Encounter for immunization: Secondary | ICD-10-CM | POA: Diagnosis not present

## 2023-01-08 DIAGNOSIS — N1831 Chronic kidney disease, stage 3a: Secondary | ICD-10-CM | POA: Diagnosis not present

## 2023-01-09 ENCOUNTER — Ambulatory Visit
Admission: RE | Admit: 2023-01-09 | Discharge: 2023-01-09 | Disposition: A | Discharge status: Home / Self Care | Payer: Medicare Other | Source: Ambulatory Visit | Attending: Family Medicine | Admitting: Family Medicine

## 2023-01-09 ENCOUNTER — Telehealth (HOSPITAL_COMMUNITY): Payer: Self-pay

## 2023-01-09 DIAGNOSIS — M8580 Other specified disorders of bone density and structure, unspecified site: Secondary | ICD-10-CM

## 2023-01-09 DIAGNOSIS — Z780 Asymptomatic menopausal state: Secondary | ICD-10-CM | POA: Diagnosis not present

## 2023-01-09 DIAGNOSIS — M8588 Other specified disorders of bone density and structure, other site: Secondary | ICD-10-CM | POA: Diagnosis not present

## 2023-01-09 NOTE — Telephone Encounter (Signed)
Detailed instructions left on the patient's answering machine. Asked to call back with any questions. S.Ulmer Degen EMTP/CCT

## 2023-01-14 ENCOUNTER — Ambulatory Visit (HOSPITAL_COMMUNITY): Payer: Medicare Other | Attending: Cardiovascular Disease

## 2023-01-14 ENCOUNTER — Ambulatory Visit (HOSPITAL_BASED_OUTPATIENT_CLINIC_OR_DEPARTMENT_OTHER): Payer: Medicare Other

## 2023-01-14 DIAGNOSIS — R0602 Shortness of breath: Secondary | ICD-10-CM

## 2023-01-14 DIAGNOSIS — R9431 Abnormal electrocardiogram [ECG] [EKG]: Secondary | ICD-10-CM

## 2023-01-14 DIAGNOSIS — R002 Palpitations: Secondary | ICD-10-CM | POA: Diagnosis not present

## 2023-01-14 DIAGNOSIS — R000 Tachycardia, unspecified: Secondary | ICD-10-CM | POA: Insufficient documentation

## 2023-01-14 LAB — ECHOCARDIOGRAM COMPLETE
Area-P 1/2: 3.03 cm2
S' Lateral: 2 cm

## 2023-01-14 LAB — MYOCARDIAL PERFUSION IMAGING
LV dias vol: 25 mL (ref 46–106)
LV sys vol: 3 mL
Nuc Stress EF: 89 %
Peak HR: 118 {beats}/min
Rest HR: 71 {beats}/min
Rest Nuclear Isotope Dose: 10.3 mCi
SDS: 5
SRS: 0
SSS: 5
ST Depression (mm): 0 mm
Stress Nuclear Isotope Dose: 31.5 mCi
TID: 0.64

## 2023-01-14 MED ORDER — TECHNETIUM TC 99M TETROFOSMIN IV KIT
31.5000 | PACK | Freq: Once | INTRAVENOUS | Status: AC | PRN
  Administered 2023-01-14: 31.5 via INTRAVENOUS

## 2023-01-14 MED ORDER — TECHNETIUM TC 99M TETROFOSMIN IV KIT
10.3000 | PACK | Freq: Once | INTRAVENOUS | Status: AC | PRN
  Administered 2023-01-14: 10.3 via INTRAVENOUS

## 2023-01-14 MED ORDER — REGADENOSON 0.4 MG/5ML IV SOLN
0.4000 mg | Freq: Once | INTRAVENOUS | Status: AC
  Administered 2023-01-14: 0.4 mg via INTRAVENOUS

## 2023-02-03 ENCOUNTER — Telehealth: Payer: Self-pay | Admitting: Internal Medicine

## 2023-02-03 NOTE — Telephone Encounter (Signed)
Patient calling bout her results. Please advise  

## 2023-02-03 NOTE — Telephone Encounter (Signed)
Patient aware of echo results. Verbalized understanding.

## 2023-02-14 ENCOUNTER — Encounter: Payer: Self-pay | Admitting: Internal Medicine

## 2023-07-03 DIAGNOSIS — M79671 Pain in right foot: Secondary | ICD-10-CM | POA: Diagnosis not present

## 2023-07-17 DIAGNOSIS — Z79899 Other long term (current) drug therapy: Secondary | ICD-10-CM | POA: Diagnosis not present

## 2023-07-17 DIAGNOSIS — E7800 Pure hypercholesterolemia, unspecified: Secondary | ICD-10-CM | POA: Diagnosis not present

## 2023-07-17 DIAGNOSIS — Z23 Encounter for immunization: Secondary | ICD-10-CM | POA: Diagnosis not present

## 2023-07-17 DIAGNOSIS — I2699 Other pulmonary embolism without acute cor pulmonale: Secondary | ICD-10-CM | POA: Diagnosis not present

## 2023-07-17 DIAGNOSIS — Z0000 Encounter for general adult medical examination without abnormal findings: Secondary | ICD-10-CM | POA: Diagnosis not present

## 2023-07-17 DIAGNOSIS — I480 Paroxysmal atrial fibrillation: Secondary | ICD-10-CM | POA: Diagnosis not present

## 2023-07-17 DIAGNOSIS — M8589 Other specified disorders of bone density and structure, multiple sites: Secondary | ICD-10-CM | POA: Diagnosis not present

## 2023-09-10 ENCOUNTER — Other Ambulatory Visit: Payer: Self-pay | Admitting: Family Medicine

## 2023-09-10 DIAGNOSIS — Z1231 Encounter for screening mammogram for malignant neoplasm of breast: Secondary | ICD-10-CM

## 2023-09-11 ENCOUNTER — Ambulatory Visit
Admission: RE | Admit: 2023-09-11 | Discharge: 2023-09-11 | Disposition: A | Discharge status: Home / Self Care | Payer: Medicare Other | Source: Ambulatory Visit | Attending: Family Medicine | Admitting: Family Medicine

## 2023-09-11 DIAGNOSIS — Z1231 Encounter for screening mammogram for malignant neoplasm of breast: Secondary | ICD-10-CM | POA: Diagnosis not present

## 2024-01-14 DIAGNOSIS — Z79899 Other long term (current) drug therapy: Secondary | ICD-10-CM | POA: Diagnosis not present

## 2024-01-14 DIAGNOSIS — R7309 Other abnormal glucose: Secondary | ICD-10-CM | POA: Diagnosis not present

## 2024-01-14 DIAGNOSIS — M79671 Pain in right foot: Secondary | ICD-10-CM | POA: Diagnosis not present

## 2024-01-14 DIAGNOSIS — I482 Chronic atrial fibrillation, unspecified: Secondary | ICD-10-CM | POA: Diagnosis not present

## 2024-01-14 DIAGNOSIS — R040 Epistaxis: Secondary | ICD-10-CM | POA: Diagnosis not present

## 2024-01-14 DIAGNOSIS — E7800 Pure hypercholesterolemia, unspecified: Secondary | ICD-10-CM | POA: Diagnosis not present

## 2024-02-23 DIAGNOSIS — L232 Allergic contact dermatitis due to cosmetics: Secondary | ICD-10-CM | POA: Diagnosis not present

## 2024-04-27 DIAGNOSIS — M25571 Pain in right ankle and joints of right foot: Secondary | ICD-10-CM | POA: Diagnosis not present

## 2024-04-27 DIAGNOSIS — M7671 Peroneal tendinitis, right leg: Secondary | ICD-10-CM | POA: Diagnosis not present

## 2024-04-30 DIAGNOSIS — R21 Rash and other nonspecific skin eruption: Secondary | ICD-10-CM | POA: Diagnosis not present

## 2024-04-30 DIAGNOSIS — B379 Candidiasis, unspecified: Secondary | ICD-10-CM | POA: Diagnosis not present

## 2024-05-04 DIAGNOSIS — M6281 Muscle weakness (generalized): Secondary | ICD-10-CM | POA: Diagnosis not present

## 2024-05-06 DIAGNOSIS — M6281 Muscle weakness (generalized): Secondary | ICD-10-CM | POA: Diagnosis not present

## 2024-05-11 DIAGNOSIS — M6281 Muscle weakness (generalized): Secondary | ICD-10-CM | POA: Diagnosis not present

## 2024-05-13 DIAGNOSIS — M6281 Muscle weakness (generalized): Secondary | ICD-10-CM | POA: Diagnosis not present

## 2024-05-18 DIAGNOSIS — M6281 Muscle weakness (generalized): Secondary | ICD-10-CM | POA: Diagnosis not present

## 2024-05-20 DIAGNOSIS — M6281 Muscle weakness (generalized): Secondary | ICD-10-CM | POA: Diagnosis not present

## 2024-05-25 DIAGNOSIS — M6281 Muscle weakness (generalized): Secondary | ICD-10-CM | POA: Diagnosis not present

## 2024-05-27 DIAGNOSIS — M6281 Muscle weakness (generalized): Secondary | ICD-10-CM | POA: Diagnosis not present

## 2024-06-01 DIAGNOSIS — M6281 Muscle weakness (generalized): Secondary | ICD-10-CM | POA: Diagnosis not present

## 2024-06-03 DIAGNOSIS — M6281 Muscle weakness (generalized): Secondary | ICD-10-CM | POA: Diagnosis not present

## 2024-06-08 DIAGNOSIS — M6281 Muscle weakness (generalized): Secondary | ICD-10-CM | POA: Diagnosis not present

## 2024-06-10 DIAGNOSIS — M6281 Muscle weakness (generalized): Secondary | ICD-10-CM | POA: Diagnosis not present

## 2024-06-15 DIAGNOSIS — M6281 Muscle weakness (generalized): Secondary | ICD-10-CM | POA: Diagnosis not present

## 2024-06-17 DIAGNOSIS — M6281 Muscle weakness (generalized): Secondary | ICD-10-CM | POA: Diagnosis not present

## 2024-06-22 DIAGNOSIS — M25571 Pain in right ankle and joints of right foot: Secondary | ICD-10-CM | POA: Diagnosis not present

## 2024-06-22 DIAGNOSIS — M6281 Muscle weakness (generalized): Secondary | ICD-10-CM | POA: Diagnosis not present

## 2024-06-24 DIAGNOSIS — M6281 Muscle weakness (generalized): Secondary | ICD-10-CM | POA: Diagnosis not present

## 2024-07-06 DIAGNOSIS — M6281 Muscle weakness (generalized): Secondary | ICD-10-CM | POA: Diagnosis not present

## 2024-07-08 DIAGNOSIS — M6281 Muscle weakness (generalized): Secondary | ICD-10-CM | POA: Diagnosis not present

## 2024-08-03 DIAGNOSIS — Z86718 Personal history of other venous thrombosis and embolism: Secondary | ICD-10-CM | POA: Diagnosis not present

## 2024-08-03 DIAGNOSIS — Z79899 Other long term (current) drug therapy: Secondary | ICD-10-CM | POA: Diagnosis not present

## 2024-08-03 DIAGNOSIS — M8589 Other specified disorders of bone density and structure, multiple sites: Secondary | ICD-10-CM | POA: Diagnosis not present

## 2024-08-03 DIAGNOSIS — I480 Paroxysmal atrial fibrillation: Secondary | ICD-10-CM | POA: Diagnosis not present

## 2024-08-03 DIAGNOSIS — N1831 Chronic kidney disease, stage 3a: Secondary | ICD-10-CM | POA: Diagnosis not present

## 2024-08-03 DIAGNOSIS — M25551 Pain in right hip: Secondary | ICD-10-CM | POA: Diagnosis not present

## 2024-08-03 DIAGNOSIS — Z23 Encounter for immunization: Secondary | ICD-10-CM | POA: Diagnosis not present

## 2024-08-03 DIAGNOSIS — E7800 Pure hypercholesterolemia, unspecified: Secondary | ICD-10-CM | POA: Diagnosis not present

## 2024-08-03 DIAGNOSIS — R7309 Other abnormal glucose: Secondary | ICD-10-CM | POA: Diagnosis not present

## 2024-08-03 DIAGNOSIS — Z0000 Encounter for general adult medical examination without abnormal findings: Secondary | ICD-10-CM | POA: Diagnosis not present

## 2024-08-03 DIAGNOSIS — I350 Nonrheumatic aortic (valve) stenosis: Secondary | ICD-10-CM | POA: Diagnosis not present

## 2024-09-09 DIAGNOSIS — R946 Abnormal results of thyroid function studies: Secondary | ICD-10-CM | POA: Diagnosis not present
